# Patient Record
Sex: Male | Born: 1985
Health system: Southern US, Community
[De-identification: ages and names within clinical notes are randomized; demographics above are authoritative.]

## PROBLEM LIST (undated history)

## (undated) DIAGNOSIS — F329 Major depressive disorder, single episode, unspecified: Secondary | ICD-10-CM

## (undated) DIAGNOSIS — F419 Anxiety disorder, unspecified: Secondary | ICD-10-CM

## (undated) DIAGNOSIS — F32A Depression, unspecified: Secondary | ICD-10-CM

## (undated) HISTORY — DX: Anxiety disorder, unspecified: F41.9

## (undated) HISTORY — DX: Major depressive disorder, single episode, unspecified: F32.9

## (undated) HISTORY — DX: Depression, unspecified: F32.A

---

## 2018-02-08 DIAGNOSIS — T148XXA Other injury of unspecified body region, initial encounter: Secondary | ICD-10-CM | POA: Diagnosis not present

## 2018-02-08 DIAGNOSIS — S61213A Laceration without foreign body of left middle finger without damage to nail, initial encounter: Secondary | ICD-10-CM | POA: Diagnosis not present

## 2018-02-18 DIAGNOSIS — Z4802 Encounter for removal of sutures: Secondary | ICD-10-CM | POA: Diagnosis not present

## 2018-03-24 ENCOUNTER — Ambulatory Visit: Payer: BLUE CROSS/BLUE SHIELD | Admitting: Family Medicine

## 2018-03-24 ENCOUNTER — Encounter: Payer: Self-pay | Admitting: Family Medicine

## 2018-03-24 ENCOUNTER — Ambulatory Visit (INDEPENDENT_AMBULATORY_CARE_PROVIDER_SITE_OTHER): Payer: BLUE CROSS/BLUE SHIELD

## 2018-03-24 VITALS — BP 122/70 | HR 41 | Temp 97.7°F | Ht 70.0 in | Wt 197.8 lb

## 2018-03-24 DIAGNOSIS — L570 Actinic keratosis: Secondary | ICD-10-CM | POA: Diagnosis not present

## 2018-03-24 DIAGNOSIS — Z6828 Body mass index (BMI) 28.0-28.9, adult: Secondary | ICD-10-CM | POA: Diagnosis not present

## 2018-03-24 DIAGNOSIS — M7989 Other specified soft tissue disorders: Secondary | ICD-10-CM | POA: Diagnosis not present

## 2018-03-24 DIAGNOSIS — M67479 Ganglion, unspecified ankle and foot: Secondary | ICD-10-CM

## 2018-03-24 DIAGNOSIS — Z114 Encounter for screening for human immunodeficiency virus [HIV]: Secondary | ICD-10-CM | POA: Diagnosis not present

## 2018-03-24 DIAGNOSIS — F39 Unspecified mood [affective] disorder: Secondary | ICD-10-CM | POA: Diagnosis not present

## 2018-03-24 DIAGNOSIS — Z13220 Encounter for screening for lipoid disorders: Secondary | ICD-10-CM | POA: Diagnosis not present

## 2018-03-24 LAB — COMPREHENSIVE METABOLIC PANEL
ALK PHOS: 67 U/L (ref 39–117)
ALT: 19 U/L (ref 0–53)
AST: 19 U/L (ref 0–37)
Albumin: 4.5 g/dL (ref 3.5–5.2)
BILIRUBIN TOTAL: 0.6 mg/dL (ref 0.2–1.2)
BUN: 13 mg/dL (ref 6–23)
CO2: 29 meq/L (ref 19–32)
CREATININE: 0.98 mg/dL (ref 0.40–1.50)
Calcium: 9.4 mg/dL (ref 8.4–10.5)
Chloride: 105 mEq/L (ref 96–112)
GFR: 94.24 mL/min (ref >60.00)
GLUCOSE: 89 mg/dL (ref 70–99)
Potassium: 3.9 mEq/L (ref 3.5–5.1)
Sodium: 141 mEq/L (ref 135–145)
TOTAL PROTEIN: 6.9 g/dL (ref 6.0–8.3)

## 2018-03-24 LAB — LIPID PANEL
CHOL/HDL RATIO: 2
Cholesterol: 141 mg/dL (ref 0–200)
HDL: 81.7 mg/dL (ref >39.00)
LDL Cholesterol: 45 mg/dL (ref 0–99)
NONHDL: 58.82
Triglycerides: 71 mg/dL (ref 0.0–149.0)
VLDL: 14.2 mg/dL (ref 0.0–40.0)

## 2018-03-24 LAB — CBC
HCT: 43 % (ref 39.0–52.0)
HEMOGLOBIN: 14.9 g/dL (ref 13.0–17.0)
MCHC: 34.5 g/dL (ref 30.0–36.0)
MCV: 88.4 fl (ref 78.0–100.0)
Platelets: 231 10*3/uL (ref 150.0–400.0)
RBC: 4.87 Mil/uL (ref 4.22–5.81)
RDW: 13.2 % (ref 11.5–15.5)
WBC: 6 10*3/uL (ref 4.0–10.5)

## 2018-03-24 LAB — TSH: TSH: 2.65 u[IU]/mL (ref 0.35–4.50)

## 2018-03-24 NOTE — Progress Notes (Signed)
Subjective:  Walter Maldonado is a 32 y.o. male who presents today with a chief complaint of mood disorder and to establish care.   HPI:  Mood Disorder, chronic problem, new to provider Started  Several year history.  He was started on sertraline by his previous physician and he has been on this for the past several years.  Symptoms have overall been very well controlled.  Reports having mostly depression with anxiety.  Also reports some concern for bipolar.  Reports that he is currently on sertraline 10 mg daily.  He has tried coming off of this in the past has not been successful due to withdrawal side effects.  No reported side effects to the medication.  Medication helps him with his mood and ability to function during his day-to-day activities.  [facility]  Decreased Interest 0  Down, Depressed, Hopeless 1  PHQ - 2 Score 1  Altered sleeping 0  Tired, decreased energy 0  Change in appetite 0  Feeling bad or failure about yourself  1  Trouble concentrating 0  Moving slowly or fidgety/restless 0  Suicidal thoughts 0  PHQ-9 Score 2  Difficult doing work/chores Not difficult at all   ROS: No SI or HI.  Skin Lesion, New Problem Located on right forehead.  Present for the past 3 to 4 years.  No treatments tried.  No bleeding.  Occasionally painful and gets irritated.  No clear precipitating events.  No obvious alleviating or aggravating factor.  Feet masses, new problem Patient with several year history of masses on the dorsal aspect of his feet bilaterally.  Patient reports that his mother has similar masses.  He has had no significant change in shape or characteristic of the mass over the past several years.  No specific treatments tried.  He thinks that it is due to his bones were performed.  He also has a few small masses on the bottom of his left foot.  These masses are not painful unless become irritated.  No specific treatments tried.  ROS: Per HPI,  otherwise a complete review of systems was negative.   PMH:  The following were reviewed and entered/updated in epic: Past Medical History:  Diagnosis Date  . Depression    Patient Active Problem List   Diagnosis Date Noted  . Mood disorder (HCC) 03/24/2018  . Actinic keratosis 03/24/2018  . Ganglion cyst of foot 03/24/2018   History reviewed. No pertinent surgical history.  Family History  Problem Relation Age of Onset  . Bipolar disorder Father   . Diabetes Father   . Diabetes Paternal Grandfather   . Diabetes Paternal Uncle   . Diabetes Paternal Uncle     Medications- reviewed and updated Current Outpatient Medications  Medication Sig Dispense Refill  . sertraline (ZOLOFT) 25 MG tablet Take 10 mg by mouth daily.     No current facility-administered medications for this visit.     Allergies-reviewed and updated No Known Allergies  Social History   Socioeconomic History  . Marital status: Married    Spouse name: Not on file  . Number of children: 4  . Years of education: Not on file  . Highest education level: Not on file  Occupational History  . Not on file  Social Needs  . Financial resource strain: Not on file  . Food insecurity:    Worry: Not on file    Inability: Not on file  . Transportation needs:    Medical: Not on file  Non-medical: Not on file  Tobacco Use  . Smoking status: Never Smoker  . Smokeless tobacco: Never Used  Substance and Sexual Activity  . Alcohol use: Not Currently  . Drug use: Never  . Sexual activity: Yes    Partners: Female  Lifestyle  . Physical activity:    Days per week: Not on file    Minutes per session: Not on file  . Stress: Not on file  Relationships  . Social connections:    Talks on phone: Not on file    Gets together: Not on file    Attends religious service: Not on file    Active member of club or organization: Not on file    Attends meetings of clubs or organizations: Not on file    Relationship  status: Not on file  Other Topics Concern  . Not on file  Social History Narrative  . Not on file    Objective:  Physical Exam: BP 122/70 (BP Location: Left Arm, Patient Position: Sitting, Cuff Size: Normal)   Pulse (!) 41   Temp 97.7 F (36.5 C) (Oral)   Ht 5\' 10"  (1.778 m)   Wt 197 lb 12.8 oz (89.7 kg)   SpO2 98%   BMI 28.38 kg/m   Gen: NAD, resting comfortably CV: RRR with no murmurs appreciated Pulm: NWOB, CTAB with no crackles, wheezes, or rhonchi GI: Normal bowel sounds present. Soft, Nontender, Nondistended. MSK:  -Left foot: Cystic lesion noted on dorsal aspect of midfoot.  Freely mobile.  No surrounding erythema or edema.  Neurovascular intact distally. Approximately 3 cystic lesions noted on flexor hallucis tendon group. -Right foot: Cystic lesion noted on dorsal aspect of midfoot.  Freely mobile.  No surrounding erythema or edema.  Neurovascular intact distally. Skin: Warm, dry.  Approximately 1 to 2 mm area of actinic keratosis on right forehead. Neuro: Grossly normal, moves all extremities Psych: Normal affect and thought content  Cryotherapy Procedure Note  Pre-operative Diagnosis: Actinic keratosis  Locations: Right forehead  Indications: Therapeutic  Procedure Details  Patient informed of risks (permanent scarring, infection, light or dark discoloration, bleeding, infection, weakness, numbness and recurrence of the lesion) and benefits of the procedure and verbal informed consent obtained.  The areas are treated with liquid nitrogen therapy, frozen until ice ball extended 2 mm beyond lesion, allowed to thaw, and treated again. The patient tolerated procedure well.  The patient was instructed on post-op care, warned that there may be blister formation, redness and pain. Recommend OTC analgesia as needed for pain.  Condition: Stable  Complications: none.  Assessment/Plan:  Mood disorder (HCC) Stable.  PHQ 9 score of 2 today.  We will continue his  current dose of Zoloft.  It is unclear how much he is taking.  Patient reports 10 mg tablet however I am not aware that it comes in this dosage.  Patient will double check when he gets home and let me know.    Check CBC, CMET, and TSH today.  Given his symptoms are stable, he will follow-up with me in 1 year.  Actinic keratosis Cryotherapy applied today.  See above procedure note.  Patient tolerated well without complication.  Discussed reasons to return to care.  Ganglion cyst of foot Patient with what appears to be bilateral ganglion cysts. Given that they have not changed in characteristic over the past several years, do not need further intervention at this point.  We will check an x-ray both of his feet today to rule out other  possible pathology.  Consider referral to sports medicine if this is become painful or problematic.  Preventive health care Check lipid panel.  Check HIV antibody.  Screen for diabetes on CMET.  Katina Degree. Jimmey Ralph, MD 03/24/2018 11:30 AM

## 2018-03-24 NOTE — Assessment & Plan Note (Signed)
Cryotherapy applied today.  See above procedure note.  Patient tolerated well without complication.  Discussed reasons to return to care.

## 2018-03-24 NOTE — Assessment & Plan Note (Signed)
Stable.  PHQ 9 score of 2 today.  We will continue his current dose of Zoloft.  It is unclear how much he is taking.  Patient reports 10 mg tablet however I am not aware that it comes in this dosage.  Patient will double check when he gets home and let me know.    Check CBC, CMET, and TSH today.  Given his symptoms are stable, he will follow-up with me in 1 year.

## 2018-03-24 NOTE — Patient Instructions (Addendum)
It was very nice to see you today!  Please keep up the good work! We will not make any medication changes today.  Please let me know what dose of sertraline you need me to send in for you.  The spot on your forehead is actinic keratosis.  This is an area of some damaged skin that could potentially turn into a precancer of the future.  We froze this area.  Please let me know if the area does not improve over the next 1 to 2 weeks.  I think the bumps on your feet are cysts.  If you are not having issues, we do not need to do anything else at this point.  We will check an x-ray today to make sure there is nothing else going on.  If they become symptomatic in the future, please let me know and we can get you to see the sports medicine doctor.  We will check blood work today.  Come back to see me in 1 year for your physical, or sooner as needed.  Take care, Dr Jimmey RalphParker

## 2018-03-24 NOTE — Assessment & Plan Note (Addendum)
Patient with what appears to be bilateral ganglion cysts. Given that they have not changed in characteristic over the past several years, do not need further intervention at this point.  We will check an x-ray both of his feet today to rule out other possible pathology.  Consider referral to sports medicine if this is become painful or problematic.

## 2018-03-25 LAB — HIV ANTIBODY (ROUTINE TESTING W REFLEX): HIV 1&2 Ab, 4th Generation: NONREACTIVE

## 2018-03-26 ENCOUNTER — Other Ambulatory Visit: Payer: Self-pay

## 2018-03-26 MED ORDER — SERTRALINE HCL 100 MG PO TABS: 100.0000 mg | ORAL_TABLET | Freq: Every day | ORAL | 3 refills | Status: DC

## 2018-05-01 ENCOUNTER — Other Ambulatory Visit: Payer: Self-pay | Admitting: Family Medicine

## 2018-10-14 ENCOUNTER — Ambulatory Visit: Payer: BLUE CROSS/BLUE SHIELD | Admitting: Family Medicine

## 2018-10-14 ENCOUNTER — Encounter: Payer: Self-pay | Admitting: Family Medicine

## 2018-10-14 ENCOUNTER — Ambulatory Visit (INDEPENDENT_AMBULATORY_CARE_PROVIDER_SITE_OTHER): Payer: BLUE CROSS/BLUE SHIELD

## 2018-10-14 ENCOUNTER — Ambulatory Visit: Payer: Self-pay | Admitting: *Deleted

## 2018-10-14 VITALS — BP 126/88 | HR 48 | Temp 97.8°F | Ht 70.0 in | Wt 204.6 lb

## 2018-10-14 DIAGNOSIS — R059 Cough, unspecified: Secondary | ICD-10-CM

## 2018-10-14 DIAGNOSIS — R05 Cough: Secondary | ICD-10-CM | POA: Diagnosis not present

## 2018-10-14 DIAGNOSIS — J0110 Acute frontal sinusitis, unspecified: Secondary | ICD-10-CM

## 2018-10-14 DIAGNOSIS — R0989 Other specified symptoms and signs involving the circulatory and respiratory systems: Secondary | ICD-10-CM | POA: Diagnosis not present

## 2018-10-14 MED ORDER — BETAMETHASONE SOD PHOS & ACET 6 (3-3) MG/ML IJ SUSP
12.0000 mg | Freq: Once | INTRAMUSCULAR | Status: AC
Start: 2018-10-14 — End: 2018-10-14
  Administered 2018-10-14: 12 mg via INTRAMUSCULAR

## 2018-10-14 MED ORDER — FLUTICASONE PROPIONATE 50 MCG/ACT NA SUSP: 2.0000 | Freq: Every day | NASAL | 6 refills | Status: DC

## 2018-10-14 NOTE — Telephone Encounter (Signed)
See note

## 2018-10-14 NOTE — Telephone Encounter (Signed)
Chest congestion for 2 weeks and now feels like it's moved to his head. Sinus congestion and sinus pressure with a loose cough with yellow phlegm. Denies fever/SOB/CP. Became dizzy with blowing his nose this morning at work. PCP not available today. Appointment made with Dr. Artis Flock for 11:40a.   Reason for Disposition . [1] MODERATE dizziness (e.g., interferes with normal activities) AND [2] has NOT been evaluated by physician for this  (Exception: dizziness caused by heat exposure, sudden standing, or poor fluid intake)  Protocols used: DIZZINESS [employer]

## 2018-10-14 NOTE — Patient Instructions (Signed)
flonase at night Cool mist humidifier Honey for cough Steroid shot today Let me know if not getting better, fever, or any more weird stuff with blowing nose.   Acute Bronchitis, Adult Acute bronchitis is when air tubes (bronchi) in the lungs suddenly get swollen. The condition can make it hard to breathe. It can also cause these symptoms:  A cough.  Coughing up clear, yellow, or green mucus.  Wheezing.  Chest congestion.  Shortness of breath.  A fever.  Body aches.  Chills.  A sore throat. Follow these instructions at home:  Medicines  Take over-the-counter and prescription medicines only as told by your doctor.  If you were prescribed an antibiotic medicine, take it as told by your doctor. Do not stop taking the antibiotic even if you start to feel better. General instructions  Rest.  Drink enough fluids to keep your pee (urine) pale yellow.  Avoid smoking and secondhand smoke. If you smoke and you need help quitting, ask your doctor. Quitting will help your lungs heal faster.  Use an inhaler, cool mist vaporizer, or humidifier as told by your doctor.  Keep all follow-up visits as told by your doctor. This is important. How is this prevented? To lower your risk of getting this condition again:  Wash your hands often with soap and water. If you cannot use soap and water, use hand sanitizer.  Avoid contact with people who have cold symptoms.  Try not to touch your hands to your mouth, nose, or eyes.  Make sure to get the flu shot every year. Contact a doctor if:  Your symptoms do not get better in 2 weeks. Get help right away if:  You cough up blood.  You have chest pain.  You have very bad shortness of breath.  You become dehydrated.  You faint (pass out) or keep feeling like you are going to pass out.  You keep throwing up (vomiting).  You have a very bad headache.  Your fever or chills gets worse. This information is not intended to replace  advice given to you by your health care provider. Make sure you discuss any questions you have with your health care provider. Document Released: 03/10/2008 Document Revised: 05/06/2017 Document Reviewed: 03/12/2016 Elsevier Interactive Patient Education  2019 [employer].

## 2018-10-14 NOTE — Progress Notes (Signed)
Patient: Walter Maldonado MRN: [id number] DOB: Jul 13, 1986 PCP: Ardith Dark, MD     Subjective:  Chief Complaint  Patient presents with  . sinus congestion  . Cough    HPI: The patient is a 33 y.o. male who presents today for sinus symptoms. Symptoms started about 2 weeks ago. He started to have congestion and cough that got progressively worse. He states this has improved some, but still can hear stuff in his chest when he breaths. He woke up yesterday AM and felt some nasal pressure. He feels like a balloon in his sinuses and about to explode. He took some allergy/sinus medication. Woke up again and had more pressure in his in sinuses. He blew his nose today and nearly fell as he got so dizzy and the whole left side of his head was pounding. No fever/chills. No nasal spray used. No teeth pain. Blowing out junk. No issues working out.   Review of Systems  Constitutional: Negative for chills, fatigue and fever.  HENT: Positive for congestion, ear pain, postnasal drip, rhinorrhea, sinus pressure and sinus pain. Negative for sore throat.   Respiratory: Positive for cough. Negative for shortness of breath.   Cardiovascular: Negative for chest pain.  Gastrointestinal: Negative for abdominal pain and nausea.  Musculoskeletal: Negative for back pain, myalgias and neck pain.  Neurological: Positive for dizziness. Negative for headaches.  Psychiatric/Behavioral: Positive for sleep disturbance.    Allergies Patient has No Known Allergies.  Past Medical History Patient  has a past medical history of Depression.  Surgical History Patient  has no past surgical history on file.  Family History Pateint's family history includes Bipolar disorder in his father; Diabetes in his father, paternal grandfather, paternal uncle, and paternal uncle; Stroke in his father.  Social History Patient  reports that he has never smoked. He has never used smokeless tobacco. He reports previous alcohol use. He  reports that he does not use drugs.    Objective: Vitals:   10/14/18 1154  BP: 126/88  Pulse: (!) 48  Temp: 97.8 F (36.6 C)  TempSrc: Oral  SpO2: 98%  Weight: 204 lb 9.6 oz (92.8 kg)  Height: 5\' 10"  (1.778 m)    Body mass index is 29.36 kg/m.  Physical Exam Vitals signs reviewed.  Constitutional:      Appearance: Normal appearance.  HENT:     Head:     Comments: Mild erythema around external left canal. No bulge, no erythema of TM No TTP over sinuses.     Right Ear: Tympanic membrane, ear canal and external ear normal.     Left Ear: Tympanic membrane, ear canal and external ear normal.  Neck:     Musculoskeletal: Normal range of motion and neck supple.  Cardiovascular:     Rate and Rhythm: Normal rate and regular rhythm.     Heart sounds: Normal heart sounds.  Pulmonary:     Effort: Pulmonary effort is normal.     Breath sounds: Wheezing (very occasional expiratory wheeze) present.     Comments: Course breath sounds.  Abdominal:     General: Abdomen is flat.     Palpations: Abdomen is soft.  Lymphadenopathy:     Cervical: No cervical adenopathy.  Skin:    Comments: Poison ivy on forearms.   Neurological:     General: No focal deficit present.     Mental Status: He is alert and oriented to person, place, and time.     Cranial Nerves: No cranial nerve deficit.  CXR: clear no pneunomia. Official read pending.   Assessment/plan: 1. Cough Likely bronchitis. Discussed he is getting better and could continue to last 6 weeks. No sign of bacterial infection. Will do steroid shot to help very rare wheeze, sinusitis and poison ivy. Declines inhaler as he does not feel tight or short of breath. Recommended honey and cool mis humidifier. Fever/worsening symptoms he is to let me know.  - DG Chest 2 View; Future - betamethasone acetate-betamethasone sodium phosphate (CELESTONE) injection 12 mg  2. Subacute frontal sinusitis Exam actually normal with no tenderness.  Conservative therapy with steroid shot and flonase. Let me know if getting worse and will send in abx, but will hold off at this time. Also reassured him that the dizzy episode was likely vagal/pressure. He's been fine and is fine working out. Please f/u if happens again or if any other abnormal symptoms or continue dizziness.       Return if symptoms worsen or fail to improve.    Orland Mustard, MD [facility]   10/14/2018

## 2018-10-14 NOTE — Telephone Encounter (Signed)
Noted  

## 2018-12-27 ENCOUNTER — Encounter: Payer: Self-pay | Admitting: Family Medicine

## 2018-12-28 ENCOUNTER — Ambulatory Visit (INDEPENDENT_AMBULATORY_CARE_PROVIDER_SITE_OTHER): Payer: BLUE CROSS/BLUE SHIELD | Admitting: Family Medicine

## 2018-12-28 ENCOUNTER — Encounter: Payer: Self-pay | Admitting: Family Medicine

## 2018-12-28 DIAGNOSIS — G5603 Carpal tunnel syndrome, bilateral upper limbs: Secondary | ICD-10-CM

## 2018-12-28 MED ORDER — DICLOFENAC SODIUM 75 MG PO TBEC: 75.0000 mg | DELAYED_RELEASE_TABLET | Freq: Two times a day (BID) | ORAL | 0 refills | Status: DC

## 2018-12-28 NOTE — Progress Notes (Signed)
    Chief Complaint:  Walter Maldonado is a 33 y.o. male who presents today for a virtual office visit with a chief complaint of paresthesias.   Assessment/Plan:  Carpal tunnel syndrome History and observation/exam consistent with carpal tunnel syndrome.  Will start diclofenac 75 mg twice daily for the next 1 to 2 weeks.  Recommended cock-up wrist splint for use at night.  Discussed home exercise program and handout will be sent via my chart.  Discussed reasons to return to care.  Follow-up as needed.    Subjective:  HPI:  Paresthesias Started about a month ago. Located in bilateral arms from the elbow down. Symptoms typically only occur after working outside however does often wake up at night with painful.  Pain is located in all of his fingers.  He has not tried anything for it.  No neck pain.  No upper arm pain.  No weakness.  No other obvious alleviating or aggravating factors.  ROS: Per HPI  PMH: He reports that he has never smoked. He has never used smokeless tobacco. He reports previous alcohol use. He reports that he does not use drugs.      Objective/Observations  Physical Exam: Gen: NAD, resting comfortably Pulm: Normal work of breathing MSK: Phalen positive.  Tinel sign negative at wrist and elbows.  Moves upper extremities spontaneously. Neuro: Grossly normal, moves all extremities Psych: Normal affect and thought content  Virtual Visit via Video   I connected with Dechlan Horstmann on 12/28/18 at  9:20 AM EDT by a video enabled telemedicine application and verified that I am speaking with the correct person using two identifiers. I discussed the limitations of evaluation and management by telemedicine and the availability of in person appointments. The patient expressed understanding and agreed to proceed.   Patient location: Home Provider location: [facility] Persons participating in the virtual visit: Myself and Patient     Katina Degree. Jimmey Ralph, MD  12/28/2018 9:56 AM

## 2019-02-08 ENCOUNTER — Other Ambulatory Visit: Payer: Self-pay | Admitting: Family Medicine

## 2019-04-05 ENCOUNTER — Other Ambulatory Visit: Payer: Self-pay | Admitting: Family Medicine

## 2019-04-12 ENCOUNTER — Encounter: Payer: Self-pay | Admitting: Family Medicine

## 2019-04-13 ENCOUNTER — Telehealth: Payer: Self-pay | Admitting: Physician Assistant

## 2019-04-13 ENCOUNTER — Other Ambulatory Visit: Payer: BC Managed Care – PPO

## 2019-04-13 ENCOUNTER — Encounter: Payer: Self-pay | Admitting: Physician Assistant

## 2019-04-13 ENCOUNTER — Ambulatory Visit (INDEPENDENT_AMBULATORY_CARE_PROVIDER_SITE_OTHER): Payer: BC Managed Care – PPO | Admitting: Physician Assistant

## 2019-04-13 ENCOUNTER — Telehealth: Payer: Self-pay | Admitting: *Deleted

## 2019-04-13 VITALS — Temp 97.9°F | Ht 70.0 in | Wt 190.0 lb

## 2019-04-13 DIAGNOSIS — R6889 Other general symptoms and signs: Secondary | ICD-10-CM | POA: Diagnosis not present

## 2019-04-13 DIAGNOSIS — Z7189 Other specified counseling: Secondary | ICD-10-CM | POA: Diagnosis not present

## 2019-04-13 DIAGNOSIS — Z20822 Contact with and (suspected) exposure to covid-19: Secondary | ICD-10-CM

## 2019-04-13 DIAGNOSIS — Z20828 Contact with and (suspected) exposure to other viral communicable diseases: Secondary | ICD-10-CM

## 2019-04-13 NOTE — Progress Notes (Signed)
Virtual Visit via Video   I connected with Walter Maldonado on 04/13/19 at  9:40 AM EDT by a video enabled telemedicine application and verified that I am speaking with the correct person using two identifiers. Location patient: Home Location provider: [facility], Office Persons participating in the virtual visit: Meredith, Mells PA-C.  I discussed the limitations of evaluation and management by telemedicine and the availability of in person appointments. The patient expressed understanding and agreed to proceed.  I acted as a Education administrator for [employer], PA-C [employer], LPN  Subjective:   HPI:   Stomach cramps Pt c/o stomach cramps and diarrhea every time after he eats, started on Sunday.  Tuesday had a fever of 100.3 and chills, sweated it out last night and temp today 97.9. Using elderberry. Denies headaches, cough, sore throat, no nausea or vomiting and no contacts with COVID-19 that he is aware of. He is trying to drink plenty of fluids. Is also taking a vitamin C supplement. Had toast and applesauce this morning and tolerated well.  ROS: See pertinent positives and negatives per HPI.  Patient Active Problem List   Diagnosis Date Noted  . Mood disorder ([place]) 03/24/2018  . Actinic keratosis 03/24/2018  . Ganglion cyst of foot 03/24/2018    Social History   Tobacco Use  . Smoking status: Never Smoker  . Smokeless tobacco: Never Used  Substance Use Topics  . Alcohol use: Not Currently    Current Outpatient Medications:  .  sertraline (ZOLOFT) 100 MG tablet, TAKE 1 TABLET BY MOUTH EVERY DAY, Disp: 90 tablet, Rfl: 2 .  fluticasone (FLONASE) 50 MCG/ACT nasal spray, SPRAY 2 SPRAYS INTO EACH NOSTRIL EVERY DAY (Patient not taking: Reported on 04/13/2019), Disp: 48 mL, Rfl: 2  No Known Allergies  Objective:   VITALS: Per patient if applicable, see vitals. GENERAL: Alert, appears well and in no acute distress. HEENT: Atraumatic, conjunctiva clear, no  obvious abnormalities on inspection of external nose and ears. NECK: Normal movements of the head and neck. CARDIOPULMONARY: No increased WOB. Speaking in clear sentences. I:E ratio WNL.  MS: Moves all visible extremities without noticeable abnormality. PSYCH: Pleasant and cooperative, well-groomed. Speech normal rate and rhythm. Affect is appropriate. Insight and judgement are appropriate. Attention is focused, linear, and appropriate.  NEURO: CN grossly intact. Oriented as arrived to appointment on time with no prompting. Moves both UE equally.  SKIN: No obvious lesions, wounds, erythema, or cyanosis noted on face or hands.  Assessment and Plan:   [place] was seen today for stomach cramping.  Diagnoses and all orders for this visit:  Suspected Covid-19 Virus Infection  Advice Given About Covid-19 Virus Infection   Patient has GI symptoms of diarrhea and low-grade fever/chills x 3 days. This is likely a viral infection, which can come from a number of respiratory viruses. At this time, I do recommend COVID-19 testing and self-quaranting until results have returned. No red flags on discussion with patient. Advised if they experience a "second sickening" or worsening symptoms as the illness progresses, they are to call the office for further instructions or seek emergent evaluation for any severe symptoms.   . Reviewed expectations re: course of current medical issues. . Discussed self-management of symptoms. . Outlined signs and symptoms indicating need for more acute intervention. . Patient verbalized understanding and all questions were answered. Marland Kitchen Health Maintenance issues including appropriate healthy diet, exercise, and smoking avoidance were discussed with patient. . See orders for this visit as  documented in the electronic medical record.  I discussed the assessment and treatment plan with the patient. The patient was provided an opportunity to ask questions and all were answered.  The patient agreed with the plan and demonstrated an understanding of the instructions.   The patient was advised to call back or seek an in-person evaluation if the symptoms worsen or if the condition fails to improve as anticipated.   CMA or LPN served as scribe during this visit. History, Physical, and Plan performed by medical provider. The above documentation has been reviewed and is accurate and complete.   [place]ampsteadSamantha Worley, GeorgiaPA 04/13/2019

## 2019-04-13 NOTE — Telephone Encounter (Signed)
Pt referred for covid-19 testing by Inda Coke, PA.  Pt notified and he is scheduled for today at the [facility] at 1:30. He is advised that this is a drive thru test site, so stay in car with mask on and windows rolled up until time for testing. He voiced understanding.

## 2019-04-13 NOTE — Telephone Encounter (Signed)
Please call patient and schedule COVID-19 testing at your earliest convenience.  Thank you, Larraine Argo PA-C  

## 2019-04-15 ENCOUNTER — Encounter: Payer: Self-pay | Admitting: Physician Assistant

## 2019-04-18 LAB — NOVEL CORONAVIRUS, NAA: SARS-CoV-2, NAA: NOT DETECTED

## 2019-10-24 DIAGNOSIS — Z20828 Contact with and (suspected) exposure to other viral communicable diseases: Secondary | ICD-10-CM | POA: Diagnosis not present

## 2019-11-13 ENCOUNTER — Other Ambulatory Visit: Payer: Self-pay | Admitting: Family Medicine

## 2019-12-28 ENCOUNTER — Telehealth: Payer: Self-pay

## 2019-12-28 NOTE — Telephone Encounter (Signed)
Patient refused Flu Vaccine. 

## 2019-12-28 NOTE — Telephone Encounter (Signed)
Documented on patient charge

## 2020-02-16 ENCOUNTER — Other Ambulatory Visit: Payer: Self-pay | Admitting: Family Medicine

## 2020-05-21 ENCOUNTER — Other Ambulatory Visit: Payer: Self-pay | Admitting: Family Medicine

## 2020-08-09 IMAGING — DX DG CHEST 2V
2 series · 2 of 2 positions shown · non-contrast
Comparison: None

CLINICAL DATA: Increased breath sounds for 2 weeks

EXAM:
CHEST - 2 VIEW

[chest pa]
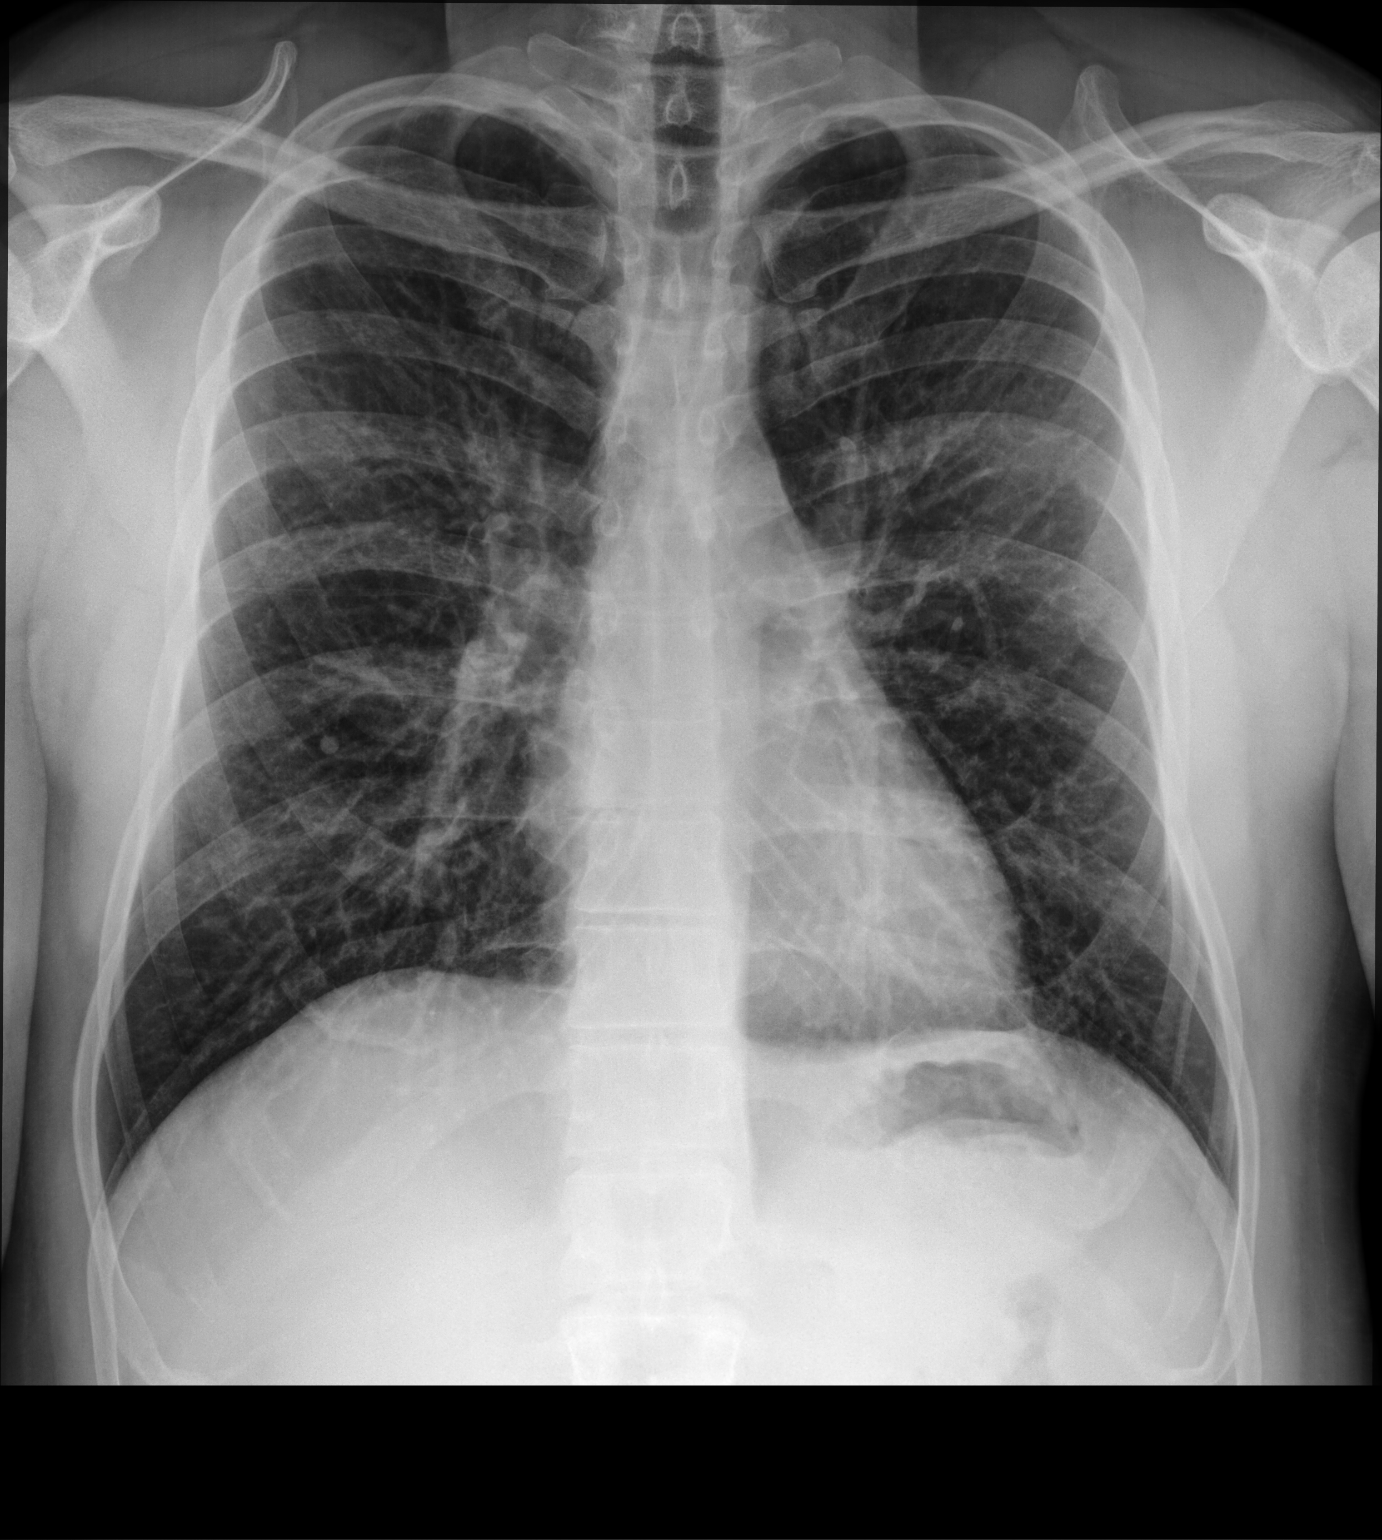

[chest lat]
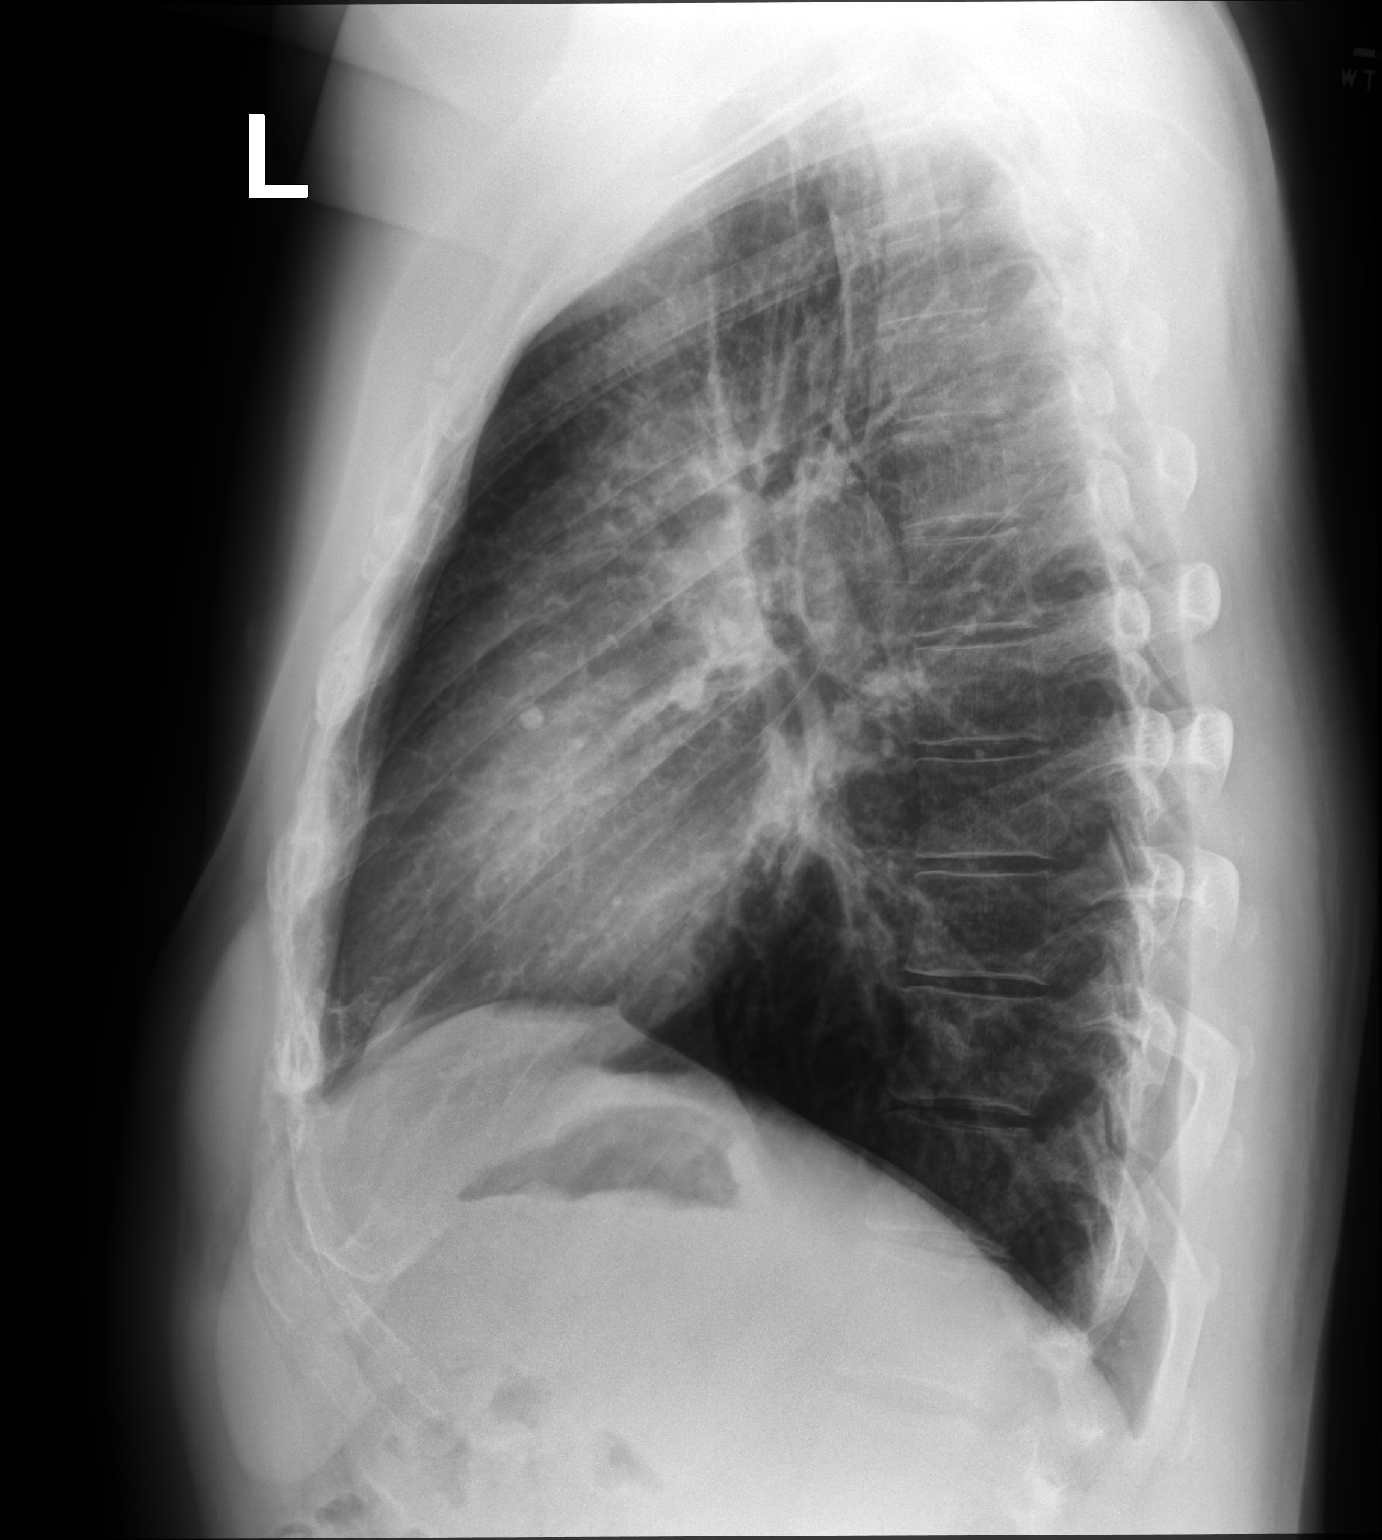

[2 of 2 positions shown; findings below may reference images not displayed]

FINDINGS: Lungs are hyperinflated likely related to a vigorous inspiratory
effort. Mild interstitial changes are noted likely related to
bronchitis. No focal confluent infiltrate or effusion is seen. No
bony abnormality is noted. Calcified granuloma is noted in the right
middle lobe.
IMPRESSION: Mild bronchitic changes without acute infiltrate

## 2020-08-20 ENCOUNTER — Telehealth: Payer: Self-pay

## 2020-08-20 ENCOUNTER — Telehealth: Payer: Self-pay | Admitting: Family Medicine

## 2020-08-20 MED ORDER — BUDESONIDE 180 MCG/ACT IN AEPB: 1.0000 | INHALATION_SPRAY | Freq: Two times a day (BID) | RESPIRATORY_TRACT | 0 refills | Status: DC

## 2020-08-20 NOTE — Telephone Encounter (Signed)
Positive at home covid test  Headache,,fatigue,fever,chills,cough  Patients requesting a call to get some recommendations

## 2020-08-20 NOTE — Telephone Encounter (Signed)
Called back patient and started him on nutraceutical bundle of vitamins, high dose aspirin (no contraindication), sent in pulmicort and advised gargling with mouth wash TID, outside and getting pulse ox. He has no difficulty breathing or shortness of breath. Just feels achy with fever. Symptoms started on Saturday. Will do telehealth and send for infusion if he qualifies as well. Need to check BMI as he has no other health issues. Also discussed ivermectin and he would like this. Discussed not FDA approved for this, but have over 65 studies showing it's benefit with no harm with appropriate dosing. He would like this and I sent in.  Discussed keeping pulse ox >93-94% and appropriate quarantining guidelines. Er precautions given.  F/u on Wednesday.  Dr. Artis Flock

## 2020-08-20 NOTE — Telephone Encounter (Signed)
Walter Maldonado, Please set him up for telehealth visit on Wednesday for covid. Already talked to him today and sent in medication, but need to follow up.  Thanks!  Dr. Artis Flock

## 2020-08-21 ENCOUNTER — Other Ambulatory Visit: Payer: Self-pay | Admitting: Family Medicine

## 2020-08-21 NOTE — Telephone Encounter (Signed)
Appointment scheduled for Wednesday @ 2:30.

## 2020-08-22 ENCOUNTER — Telehealth: Payer: BC Managed Care – PPO | Admitting: Family Medicine

## 2020-08-22 NOTE — Progress Notes (Deleted)
Patient: Walter Maldonado MRN: [id number] DOB: 1986/03/05 PCP: Ardith Dark, MD     I connected with Drue Stager on 08/22/20 at @CHLAPPTIME @ by a video enabled telemedicine application and verified that I am speaking with the correct person using two identifiers.  Location patient: Home Location provider: [facility], Office Persons participating in this virtual visit: ***  I discussed the limitations of evaluation and management by telemedicine and the availability of in person appointments. The patient expressed understanding and agreed to proceed.   Subjective:  No chief complaint on file.   HPI: The patient is a 34 y.o. male who presents today for Positive Covid result.   Review of Systems  Allergies Patient has No Known Allergies.  Past Medical History Patient  has a past medical history of Depression.  Surgical History Patient  has no past surgical history on file.  Family History Pateint's family history includes Bipolar disorder in his father; Diabetes in his father, paternal grandfather, paternal uncle, and paternal uncle; Stroke in his father.  Social History Patient  reports that he has never smoked. He has never used smokeless tobacco. He reports previous alcohol use. He reports that he does not use drugs.    Objective: There were no vitals filed for this visit.  There is no height or weight on file to calculate BMI.  Physical Exam     Assessment/plan:      No follow-ups on file.  Records requested if needed. Time spent with patient: *** minutes, of which >50% was spent in obtaining information about his symptoms, reviweing his previous labs, evaluations, and treatments, counseling him about his conditions (please see discussed topics above), and developing a plan to further investigate it; he had a number of questions which I addressed.    20, MD [facility]  08/22/2020

## 2020-11-18 ENCOUNTER — Other Ambulatory Visit: Payer: Self-pay | Admitting: Family Medicine

## 2020-11-19 NOTE — Telephone Encounter (Signed)
LVM to schedule appointe with PCP Last OV on 09/07/2019

## 2020-12-17 DIAGNOSIS — F419 Anxiety disorder, unspecified: Secondary | ICD-10-CM | POA: Diagnosis not present

## 2020-12-17 DIAGNOSIS — F438 Other reactions to severe stress: Secondary | ICD-10-CM | POA: Diagnosis not present

## 2020-12-24 DIAGNOSIS — F419 Anxiety disorder, unspecified: Secondary | ICD-10-CM | POA: Diagnosis not present

## 2020-12-24 DIAGNOSIS — F438 Other reactions to severe stress: Secondary | ICD-10-CM | POA: Diagnosis not present

## 2021-01-16 DIAGNOSIS — F419 Anxiety disorder, unspecified: Secondary | ICD-10-CM | POA: Diagnosis not present

## 2021-01-16 DIAGNOSIS — F438 Other reactions to severe stress: Secondary | ICD-10-CM | POA: Diagnosis not present

## 2021-01-30 DIAGNOSIS — F438 Other reactions to severe stress: Secondary | ICD-10-CM | POA: Diagnosis not present

## 2021-01-30 DIAGNOSIS — F419 Anxiety disorder, unspecified: Secondary | ICD-10-CM | POA: Diagnosis not present

## 2021-02-07 DIAGNOSIS — F419 Anxiety disorder, unspecified: Secondary | ICD-10-CM | POA: Diagnosis not present

## 2021-02-07 DIAGNOSIS — Z0000 Encounter for general adult medical examination without abnormal findings: Secondary | ICD-10-CM | POA: Diagnosis not present

## 2021-02-07 DIAGNOSIS — F438 Other reactions to severe stress: Secondary | ICD-10-CM | POA: Diagnosis not present

## 2021-02-07 DIAGNOSIS — Z13220 Encounter for screening for lipoid disorders: Secondary | ICD-10-CM | POA: Diagnosis not present

## 2021-02-07 DIAGNOSIS — E559 Vitamin D deficiency, unspecified: Secondary | ICD-10-CM | POA: Diagnosis not present

## 2021-02-14 DIAGNOSIS — F438 Other reactions to severe stress: Secondary | ICD-10-CM | POA: Diagnosis not present

## 2021-02-14 DIAGNOSIS — F419 Anxiety disorder, unspecified: Secondary | ICD-10-CM | POA: Diagnosis not present

## 2021-02-21 ENCOUNTER — Other Ambulatory Visit: Payer: Self-pay | Admitting: Family Medicine

## 2021-02-21 NOTE — Telephone Encounter (Signed)
Left message to return call to our office at their convenience. Patient need OV for medication refills Last visit on 04/13/2019

## 2021-02-28 DIAGNOSIS — F419 Anxiety disorder, unspecified: Secondary | ICD-10-CM | POA: Diagnosis not present

## 2021-02-28 DIAGNOSIS — F438 Other reactions to severe stress: Secondary | ICD-10-CM | POA: Diagnosis not present

## 2021-03-11 DIAGNOSIS — F411 Generalized anxiety disorder: Secondary | ICD-10-CM | POA: Diagnosis not present

## 2021-03-11 DIAGNOSIS — W231XXA Caught, crushed, jammed, or pinched between stationary objects, initial encounter: Secondary | ICD-10-CM | POA: Diagnosis not present

## 2021-03-11 DIAGNOSIS — Z79899 Other long term (current) drug therapy: Secondary | ICD-10-CM | POA: Diagnosis not present

## 2021-03-11 DIAGNOSIS — S9781XA Crushing injury of right foot, initial encounter: Secondary | ICD-10-CM | POA: Diagnosis not present

## 2021-03-11 DIAGNOSIS — S99921A Unspecified injury of right foot, initial encounter: Secondary | ICD-10-CM | POA: Diagnosis not present

## 2021-08-15 DIAGNOSIS — M50321 Other cervical disc degeneration at C4-C5 level: Secondary | ICD-10-CM | POA: Diagnosis not present

## 2021-08-15 DIAGNOSIS — M5459 Other low back pain: Secondary | ICD-10-CM | POA: Diagnosis not present

## 2022-06-30 ENCOUNTER — Encounter: Payer: Self-pay | Admitting: *Deleted

## 2022-08-27 DIAGNOSIS — F419 Anxiety disorder, unspecified: Secondary | ICD-10-CM | POA: Diagnosis not present

## 2022-09-02 DIAGNOSIS — F419 Anxiety disorder, unspecified: Secondary | ICD-10-CM | POA: Diagnosis not present

## 2022-09-10 DIAGNOSIS — F419 Anxiety disorder, unspecified: Secondary | ICD-10-CM | POA: Diagnosis not present

## 2022-09-16 DIAGNOSIS — F419 Anxiety disorder, unspecified: Secondary | ICD-10-CM | POA: Diagnosis not present

## 2022-09-18 ENCOUNTER — Encounter: Payer: Self-pay | Admitting: *Deleted

## 2022-10-14 DIAGNOSIS — F419 Anxiety disorder, unspecified: Secondary | ICD-10-CM | POA: Diagnosis not present

## 2022-10-21 DIAGNOSIS — F419 Anxiety disorder, unspecified: Secondary | ICD-10-CM | POA: Diagnosis not present

## 2022-10-24 ENCOUNTER — Ambulatory Visit (INDEPENDENT_AMBULATORY_CARE_PROVIDER_SITE_OTHER): Payer: BC Managed Care – PPO | Admitting: Physician Assistant

## 2022-10-24 ENCOUNTER — Encounter: Payer: Self-pay | Admitting: Physician Assistant

## 2022-10-24 VITALS — BP 136/79 | Ht 68.0 in | Wt 194.4 lb

## 2022-10-24 DIAGNOSIS — F32A Depression, unspecified: Secondary | ICD-10-CM

## 2022-10-24 DIAGNOSIS — F411 Generalized anxiety disorder: Secondary | ICD-10-CM

## 2022-10-24 MED ORDER — SERTRALINE HCL 100 MG PO TABS: 150.0000 mg | ORAL_TABLET | Freq: Every day | ORAL | 0 refills | Status: DC
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
## 2022-11-04 DIAGNOSIS — F419 Anxiety disorder, unspecified: Secondary | ICD-10-CM | POA: Diagnosis not present

## 2022-11-20 DIAGNOSIS — F419 Anxiety disorder, unspecified: Secondary | ICD-10-CM | POA: Diagnosis not present

## 2022-11-25 DIAGNOSIS — F419 Anxiety disorder, unspecified: Secondary | ICD-10-CM | POA: Diagnosis not present

## 2022-12-02 DIAGNOSIS — F419 Anxiety disorder, unspecified: Secondary | ICD-10-CM | POA: Diagnosis not present

## 2022-12-05 ENCOUNTER — Encounter: Payer: Self-pay | Admitting: Physician Assistant

## 2022-12-05 ENCOUNTER — Ambulatory Visit (INDEPENDENT_AMBULATORY_CARE_PROVIDER_SITE_OTHER): Payer: BC Managed Care – PPO | Admitting: Physician Assistant

## 2022-12-05 DIAGNOSIS — F32A Depression, unspecified: Secondary | ICD-10-CM | POA: Diagnosis not present

## 2022-12-05 DIAGNOSIS — F411 Generalized anxiety disorder: Secondary | ICD-10-CM | POA: Diagnosis not present

## 2022-12-05 MED ORDER — SERTRALINE HCL 100 MG PO TABS: 150.0000 mg | ORAL_TABLET | Freq: Every day | ORAL | 0 refills | Status: DC

## 2022-12-05 NOTE — Progress Notes (Signed)
[facility] Med Check  Patient ID: Walter Maldonado,  MRN: [id number]  PCP: Vivi Barrack, MD  Date of Evaluation: 12/05/2022 Time spent:25 minutes  Chief Complaint:  Chief Complaint   Anxiety; Depression; Follow-up    Virtual Visit via Telehealth  I connected with patient by telephone, with their informed consent, and verified patient privacy and that I am speaking with the correct person using two identifiers.  I am private, in my office and the patient is at work.  I discussed the limitations, risks, security and privacy concerns of performing an evaluation and management service by telephone and the availability of in person appointments. I also discussed with the patient that there may be a patient responsible charge related to this service. The patient expressed understanding and agreed to proceed.   I discussed the assessment and treatment plan with the patient. The patient was provided an opportunity to ask questions and all were answered. The patient agreed with the plan and demonstrated an understanding of the instructions.   The patient was advised to call back or seek an in-person evaluation if the symptoms worsen or if the condition fails to improve as anticipated.  I provided 25 minutes of non-face-to-face time during this encounter.  HISTORY/CURRENT STATUS: HPI For 6 week med check.  Increased Zoloft 6 weeks ago. States he is much better and likes the improvement he's had. His wife has noticed it too. He's been under some temporary stress at work and he's handled it much better than he would have a few months ago. Patient is able to enjoy things.  Energy and motivation are good.  No extreme sadness, tearfulness, or feelings of hopelessness.  Sleeps well most of the time. ADLs and personal hygiene are normal. States he's been scattered brained a few times since increasing the Zoloft but not necessarily related to the change.  "It might just be the stress."   Appetite has  not changed.  Weight is stable.  No PA and generalized anxiety is a lot better. Denies suicidal or homicidal thoughts.  Patient denies increased energy with decreased need for sleep, increased talkativeness, racing thoughts, impulsivity or risky behaviors, increased spending, increased libido, grandiosity, increased irritability or anger, paranoia, or hallucinations.  Denies dizziness, syncope, seizures, numbness, tingling, tremor, tics, unsteady gait, slurred speech, confusion. Denies muscle or joint pain, stiffness, or dystonia.  Individual Medical History/ Review of Systems: Changes? :No   Past Psychiatric History:    Past medications for mental health diagnoses include: Zoloft started 2012   No psych hospitalizations, no suicide attempts   Allergies: Penicillins  Current Medications:  Current Outpatient Medications:    Multiple Vitamin (MULTIVITAMIN) capsule, Take 1 capsule by mouth daily., Disp: , Rfl:    sertraline (ZOLOFT) 100 MG tablet, Take 1.5 tablets (150 mg total) by mouth daily., Disp: 135 tablet, Rfl: 0 Medication Side Effects: none  Family Medical/ Social History: Changes? No  MENTAL HEALTH EXAM:  There were no vitals taken for this visit.There is no height or weight on file to calculate BMI.  General Appearance:  unable to assess  Eye Contact:   unable to assess  Speech:  Clear and Coherent and Normal Rate  Volume:  Normal  Mood:  Euthymic  Affect:   unable to assess  Thought Process:  Goal Directed and Descriptions of Associations: Circumstantial  Orientation:  Full (Time, Place, and Person)  Thought Content: Logical   Suicidal Thoughts:  No  Homicidal Thoughts:  No  Memory:  WNL  Judgement:  Good  Insight:  Good  Psychomotor Activity:   unable to assess  Concentration:  Concentration: Good  Recall:  Good  Fund of Knowledge: Good  Language: Good  Assets:  Communication Skills Desire for Improvement Financial  Resources/Insurance Housing Transportation Vocational/Educational  ADL's:  Intact  Cognition: WNL  Prognosis:  Good   DIAGNOSES:    ICD-10-CM   1. Generalized anxiety disorder  F41.1     2. Mild depression  F32.A      Receiving Psychotherapy: Yes  with Evelena Peat Deussing  RECOMMENDATIONS:  PDMP reviewed. No controlled substances. I provided 25  minutes of non-face-to-face time during this encounter, including time spent before and after the visit in records review, medical decision making, counseling pertinent to today's visit, and charting.   I'm glad he's doing so well! The 'scatterbrained' episodes he's had are likely d/t stress at work but if they worsen or don't improve, will re-eval.  Continue Zoloft 100 mg, 1.5 qd Continue MVI daily. Continue therapy with Evelena Peat Deussing. Return in 3 months.   Donnal Moat, PA-C

## 2022-12-09 DIAGNOSIS — F419 Anxiety disorder, unspecified: Secondary | ICD-10-CM | POA: Diagnosis not present

## 2022-12-16 DIAGNOSIS — F419 Anxiety disorder, unspecified: Secondary | ICD-10-CM | POA: Diagnosis not present

## 2023-01-07 DIAGNOSIS — F419 Anxiety disorder, unspecified: Secondary | ICD-10-CM | POA: Diagnosis not present

## 2023-01-22 ENCOUNTER — Ambulatory Visit (INDEPENDENT_AMBULATORY_CARE_PROVIDER_SITE_OTHER): Payer: BC Managed Care – PPO | Admitting: Physician Assistant

## 2023-01-22 ENCOUNTER — Encounter: Payer: Self-pay | Admitting: Physician Assistant

## 2023-01-22 DIAGNOSIS — F32A Depression, unspecified: Secondary | ICD-10-CM

## 2023-01-22 DIAGNOSIS — F411 Generalized anxiety disorder: Secondary | ICD-10-CM | POA: Diagnosis not present

## 2023-01-22 NOTE — Progress Notes (Signed)
[facility] Med Check  Patient ID: Walter Maldonado,  MRN: [id number]  PCP: Ardith Dark, MD  Date of Evaluation: 01/22/2023 Time spent:20 minutes  Chief Complaint:  Chief Complaint   Follow-up    HISTORY/CURRENT STATUS: HPI For 6 week med check.  Since increasing Zoloft 3 months ago, anxiety has been better. Still has times where he feels overwhelmed, gets frustrated easily. His therapist, Kelton Pillar, disc w/ him possibility of mood d/o.  Still has a lot of probs with focus.  Has appt w/ [facility] in June, hoping to get some answers there.   Patient is able to enjoy things.  Energy and motivation are good.  Work is going well.   No extreme sadness, tearfulness, or feelings of hopelessness.  Sleeps well most of the time. ADLs and personal hygiene are normal.   Appetite has not changed.  Weight is stable. Denies suicidal or homicidal thoughts.  Patient denies increased energy with decreased need for sleep, increased talkativeness, racing thoughts, impulsivity or risky behaviors, increased spending, increased libido, grandiosity, paranoia, or hallucinations.  Denies dizziness, syncope, seizures, numbness, tingling, tremor, tics, unsteady gait, slurred speech, confusion. Denies muscle or joint pain, stiffness, or dystonia.  Individual Medical History/ Review of Systems: Changes? :No   Past Psychiatric History:    Past medications for mental health diagnoses include: Zoloft started 2012   No psych hospitalizations, no suicide attempts   Allergies: Penicillins  Current Medications:  Current Outpatient Medications:    Multiple Vitamin (MULTIVITAMIN) capsule, Take 1 capsule by mouth daily., Disp: , Rfl:    sertraline (ZOLOFT) 100 MG tablet, Take 1.5 tablets (150 mg total) by mouth daily., Disp: 135 tablet, Rfl: 0 Medication Side Effects: none  Family Medical/ Social History: Changes? No  MENTAL HEALTH EXAM:  There were no vitals taken for this  visit.There is no height or weight on file to calculate BMI.  General Appearance: Casual and Well Groomed  Eye Contact:  Good  Speech:  Clear and Coherent and Normal Rate  Volume:  Normal  Mood:  Euthymic  Affect:  Congruent  Thought Process:  Goal Directed and Descriptions of Associations: Circumstantial  Orientation:  Full (Time, Place, and Person)  Thought Content: Logical   Suicidal Thoughts:  No  Homicidal Thoughts:  No  Memory:  WNL  Judgement:  Good  Insight:  Good  Psychomotor Activity:  Normal  Concentration:  Concentration: Good  Recall:  Good  Fund of Knowledge: Good  Language: Good  Assets:  Communication Skills Desire for Improvement Financial Resources/Insurance Housing Transportation Vocational/Educational  ADL's:  Intact  Cognition: WNL  Prognosis:  Good   DIAGNOSES:    ICD-10-CM   1. Generalized anxiety disorder  F41.1     2. Mild depression  F32.A       Receiving Psychotherapy: Yes  with [name]  RECOMMENDATIONS:  PDMP reviewed. No controlled substances. I provided 20 minutes of face to face time during this encounter, including time spent before and after the visit in records review, medical decision making, counseling pertinent to today's visit, and charting.   We discussed his symptoms.  He very well could have a mood disorder, however I do not want to diagnose that without having more information.  Specifically his evaluation at [facility].  If he does have ADD/ADHD, that can explain his temperament.  He agrees with waiting on that appointment before making any changes.  Continue Zoloft 100 mg, 1.5 qd Continue MVI daily. Continue therapy with Brayton Caves  Deussing. Return in 2 months.   Melony Overly, PA-C

## 2023-01-28 DIAGNOSIS — F419 Anxiety disorder, unspecified: Secondary | ICD-10-CM | POA: Diagnosis not present

## 2023-02-10 DIAGNOSIS — F419 Anxiety disorder, unspecified: Secondary | ICD-10-CM | POA: Diagnosis not present

## 2023-02-17 DIAGNOSIS — F419 Anxiety disorder, unspecified: Secondary | ICD-10-CM | POA: Diagnosis not present

## 2023-02-24 DIAGNOSIS — F419 Anxiety disorder, unspecified: Secondary | ICD-10-CM | POA: Diagnosis not present

## 2023-03-10 DIAGNOSIS — F419 Anxiety disorder, unspecified: Secondary | ICD-10-CM | POA: Diagnosis not present

## 2023-03-14 ENCOUNTER — Other Ambulatory Visit: Payer: Self-pay | Admitting: Physician Assistant

## 2023-03-17 DIAGNOSIS — F419 Anxiety disorder, unspecified: Secondary | ICD-10-CM | POA: Diagnosis not present

## 2023-03-19 DIAGNOSIS — R41840 Attention and concentration deficit: Secondary | ICD-10-CM | POA: Diagnosis not present

## 2023-03-19 DIAGNOSIS — F419 Anxiety disorder, unspecified: Secondary | ICD-10-CM | POA: Diagnosis not present

## 2023-03-20 DIAGNOSIS — R41840 Attention and concentration deficit: Secondary | ICD-10-CM | POA: Diagnosis not present

## 2023-03-20 DIAGNOSIS — Z79899 Other long term (current) drug therapy: Secondary | ICD-10-CM | POA: Diagnosis not present

## 2023-03-23 ENCOUNTER — Encounter: Payer: Self-pay | Admitting: Physician Assistant

## 2023-03-23 ENCOUNTER — Ambulatory Visit (INDEPENDENT_AMBULATORY_CARE_PROVIDER_SITE_OTHER): Payer: BC Managed Care – PPO | Admitting: Physician Assistant

## 2023-03-23 DIAGNOSIS — F902 Attention-deficit hyperactivity disorder, combined type: Secondary | ICD-10-CM

## 2023-03-23 DIAGNOSIS — F411 Generalized anxiety disorder: Secondary | ICD-10-CM | POA: Diagnosis not present

## 2023-03-23 DIAGNOSIS — F32A Depression, unspecified: Secondary | ICD-10-CM | POA: Diagnosis not present

## 2023-03-23 MED ORDER — LISDEXAMFETAMINE DIMESYLATE 20 MG PO CAPS: 20.0000 mg | ORAL_CAPSULE | Freq: Every day | ORAL | 0 refills | Status: DC

## 2023-03-23 NOTE — Progress Notes (Signed)
[facility]  Patient ID: Walter Maldonado,  MRN: [id number]  PCP: Walter Dark, MD  Date of Evaluation: 03/23/2023 Time spent:30 minutes  Chief Complaint:  Chief Complaint   Anxiety; Depression; Follow-up    HISTORY/CURRENT STATUS: HPI For 6 week med check. Wife, Walter Maldonado, is with him.   He had testing for ADHD at [name]. States he aced it, nothing to point toward attn deficit. He has always been a high achiever but feels like he has to work so hard to accomplish things, he'll get very aggravated if/when he's interrupted and has a hard time getting back into a project. He does get distracted easily when really tired, like when he gets home from work, Walter Maldonado feels like he's given everything he has to his job and then by the time he gets home, he can't give anymore.  That irritates him but he doesn't feel like he can do anything about it. Does have some mood swings but as described, feels like it's triggered. Patient denies increased energy with decreased need for sleep, increased talkativeness, racing thoughts, impulsivity or risky behaviors, increased spending, in fact he hates spending money, increased libido, grandiosity, paranoia, or hallucinations.  He's been on Zoloft for years and doesn't feel like it's ever helped in any way. Even with recent increase.  Patient is able to enjoy things.  Energy and motivation are good.  No extreme sadness, tearfulness, or feelings of hopelessness.  Sleeps well. He doesn't do well if he misses sleep.  ADLs and personal hygiene are normal.  Appetite has not changed.  Weight is stable.  Denies suicidal or homicidal thoughts.  Denies dizziness, syncope, seizures, numbness, tingling, tremor, tics, unsteady gait, slurred speech, confusion. Denies muscle or joint pain, stiffness, or dystonia.  Individual Medical History/ Review of Systems: Changes? :No   Past Psychiatric History:    Past medications for mental health diagnoses  include: Zoloft started 2012   No psych hospitalizations, no suicide attempts   Allergies: Penicillins  Current Medications:  Current Outpatient Medications:    lisdexamfetamine (VYVANSE) 20 MG capsule, Take 1 capsule (20 mg total) by mouth daily., Disp: 30 capsule, Rfl: 0   Multiple Vitamin (MULTIVITAMIN) capsule, Take 1 capsule by mouth daily., Disp: , Rfl:    sertraline (ZOLOFT) 100 MG tablet, TAKE 1.5 TABLETS (150MG  TOTAL) BY MOUTH DAILY, Disp: 135 tablet, Rfl: 0 Medication Side Effects: none  Family Medical/ Social History: Changes? No  MENTAL HEALTH EXAM:  There were no vitals taken for this visit.There is no height or weight on file to calculate BMI.  General Appearance: Casual and Well Groomed  Eye Contact:  Good  Speech:  Clear and Coherent and Normal Rate  Volume:  Normal  Mood:   sad  Affect:  Congruent  Thought Process:  Goal Directed and Descriptions of Associations: Circumstantial  Orientation:  Full (Time, Place, and Person)  Thought Content: Logical   Suicidal Thoughts:  No  Homicidal Thoughts:  No  Memory:  WNL  Judgement:  Good  Insight:  Good  Psychomotor Activity:  Normal  Concentration:  Concentration: Good and Attention Span: Fair  Recall:  Good  Fund of Knowledge: Good  Language: Good  Assets:  Communication Skills Desire for Improvement Financial Resources/Insurance Housing Transportation Vocational/Educational  ADL's:  Intact  Cognition: WNL  Prognosis:  Good   DIAGNOSES:    ICD-10-CM   1. Attention deficit hyperactivity disorder (ADHD), combined type  F90.2     2. Generalized anxiety disorder  F41.1     3. Mild depression  F32.A      Receiving Psychotherapy: Yes  with Walter Maldonado  RECOMMENDATIONS:  PDMP reviewed. No controlled substances. I provided 30 minutes of face to face time during this encounter, including time spent before and after the visit in records review, medical decision making, counseling pertinent to today's  visit, and charting.   Long discussion about dx and tx options. After getting collateral info from Walter Maldonado, his behaviors seem like ADHD combined type. Even though the formal testing didn't point that way. We discussed the possibility of Bip II d/o, I explained that sometimes sx can overlap and it's hard to differentiate between the dx. I feel like treating the ADHD is most important now and will watch for Bip sx. I provided 20 mins of counseling concerning med options for ADHD. Stimulants, Strattera, Quelbree, Clonidine, Guanfacine all disc. He would like to start a stimulant.  Discussed amphetamines vs methylphenidates.  We agreed on Vyvanse, he understands it may be difficult to find.  If generic not available,will try for brand but if too expensive, will go to Adderall XR.  Discussed potential benefits, risks, and side effects of stimulants with patient to include increased heart rate, palpitations, insomnia, increased anxiety, increased irritability, or decreased appetite.  The patient understands and accepts these risks.  Instructed patient to contact office if experiencing any significant tolerability issues.  For now, cont Zoloft, but will plan to wean off after we get the tx for ADHD nailed down.  Start Vyvanse 20 mg, 1 q am. Ok to skip weekend if he wants.  Continue Zoloft 100 mg, 1.5 qd Continue MVI daily. Continue therapy with Walter Maldonado. Return in 4 weeks.  Walter Overly, PA-C

## 2023-03-24 ENCOUNTER — Ambulatory Visit: Payer: BC Managed Care – PPO | Admitting: Physician Assistant

## 2023-03-25 DIAGNOSIS — F419 Anxiety disorder, unspecified: Secondary | ICD-10-CM | POA: Diagnosis not present

## 2023-04-01 DIAGNOSIS — F419 Anxiety disorder, unspecified: Secondary | ICD-10-CM | POA: Diagnosis not present

## 2023-04-24 ENCOUNTER — Ambulatory Visit (INDEPENDENT_AMBULATORY_CARE_PROVIDER_SITE_OTHER): Payer: BC Managed Care – PPO | Admitting: Physician Assistant

## 2023-04-24 DIAGNOSIS — F32A Depression, unspecified: Secondary | ICD-10-CM

## 2023-04-24 DIAGNOSIS — F411 Generalized anxiety disorder: Secondary | ICD-10-CM | POA: Diagnosis not present

## 2023-04-24 DIAGNOSIS — F902 Attention-deficit hyperactivity disorder, combined type: Secondary | ICD-10-CM

## 2023-04-29 ENCOUNTER — Encounter: Payer: Self-pay | Admitting: Physician Assistant

## 2023-04-29 NOTE — Progress Notes (Signed)
[facility]  Patient ID: Walter Maldonado,  MRN: [id number]  PCP: Ardith Dark, MD  Date of Evaluation: 04/24/2023 Time spent:20 minutes  Chief Complaint:  Chief Complaint   Follow-up    HISTORY/CURRENT STATUS: HPI for 1 month med check.  We started Vyvanse last month.  For the first 2 weeks it worked great.  He was much more able to stay on task and get things done.  He felt really good about it.  The last 2 weeks however he has been more like a "zombie" not feeling like or wanting to play with his kids or being engaged with family or friends.  He is concerned that it may continue.  He is still able to focus and concentrate and is getting work done but just does not like that side effects.  We have talked several times about this Zoloft.  He has been on it for a long time, does not really feel that it has ever done thing to help with his mood or anxiety.  He would like to go off of it if that is appropriate, to get that out of the equation.  Patient is able to enjoy things.  Energy and motivation are fair to good.  Work is going well.   No extreme sadness, tearfulness, or feelings of hopelessness.  Sleeps well most of the time. ADLs and personal hygiene are normal.  No change in memory.  Appetite has not changed.  Weight is stable.  Reports no anxiety.  Denies suicidal or homicidal thoughts.  Patient denies increased energy with decreased need for sleep, increased talkativeness, racing thoughts, impulsivity or risky behaviors, increased spending, increased libido, grandiosity, increased irritability or anger, paranoia, or hallucinations.  Denies dizziness, syncope, seizures, numbness, tingling, tremor, tics, unsteady gait, slurred speech, confusion. Denies muscle or joint pain, stiffness, or dystonia.  Individual Medical History/ Review of Systems: Changes? :No   Past Psychiatric History:    Past medications for mental health diagnoses include: Zoloft started 2012   No  psych hospitalizations, no suicide attempts   Allergies: Penicillins  Current Medications:  Current Outpatient Medications:    lisdexamfetamine (VYVANSE) 20 MG capsule, Take 1 capsule (20 mg total) by mouth daily., Disp: 30 capsule, Rfl: 0   Multiple Vitamin (MULTIVITAMIN) capsule, Take 1 capsule by mouth daily., Disp: , Rfl:    sertraline (ZOLOFT) 100 MG tablet, TAKE 1.5 TABLETS (150MG  TOTAL) BY MOUTH DAILY, Disp: 135 tablet, Rfl: 0 Medication Side Effects: none  Family Medical/ Social History: Changes? No  MENTAL HEALTH EXAM:  There were no vitals taken for this visit.There is no height or weight on file to calculate BMI.  General Appearance: Casual and Well Groomed  Eye Contact:  Good  Speech:  Clear and Coherent and Normal Rate  Volume:  Normal  Mood:  Euthymic  Affect:  Congruent  Thought Process:  Goal Directed and Descriptions of Associations: Circumstantial  Orientation:  Full (Time, Place, and Person)  Thought Content: Logical   Suicidal Thoughts:  No  Homicidal Thoughts:  No  Memory:  WNL  Judgement:  Good  Insight:  Good  Psychomotor Activity:  Normal  Concentration:  Concentration: Good and Attention Span: Good  Recall:  Good  Fund of Knowledge: Good  Language: Good  Assets:  Communication Skills Desire for Improvement Financial Resources/Insurance Housing Transportation Vocational/Educational  ADL's:  Intact  Cognition: WNL  Prognosis:  Good   DIAGNOSES:    ICD-10-CM   1. Attention deficit hyperactivity disorder (ADHD), combined

## 2023-04-30 ENCOUNTER — Encounter: Payer: Self-pay | Admitting: Physician Assistant

## 2023-04-30 NOTE — Progress Notes (Deleted)
[facility]  Patient ID: Walter Maldonado,  MRN: [id number]  PCP: Ardith Dark, MD  Date of Evaluation: 04/24/2023 Time spent:20 minutes  Chief Complaint:  Chief Complaint   Follow-up    HISTORY/CURRENT STATUS: HPI for 1 month med check.  We started Vyvanse last month.  For the first 2 weeks it worked great.  He was much more able to stay on task and get things done.  He felt really good about it.  The last 2 weeks however he has been more like a "zombie" not feeling like or wanting to play with his kids or being engaged with family or friends.  He is concerned that it may continue.  He is still able to focus and concentrate and is getting work done but just does not like that side effects.  We have talked several times about this Zoloft.  He has been on it for a long time, does not really feel that it has ever done thing to help with his mood or anxiety.  He would like to go off of it if that is appropriate, to get that out of the equation.  Patient is able to enjoy things.  Energy and motivation are fair to good.  Work is going well.   No extreme sadness, tearfulness, or feelings of hopelessness.  Sleeps well most of the time. ADLs and personal hygiene are normal.  No change in memory.  Appetite has not changed.  Weight is stable.  Reports no anxiety.  Denies suicidal or homicidal thoughts.  Patient denies increased energy with decreased need for sleep, increased talkativeness, racing thoughts, impulsivity or risky behaviors, increased spending, increased libido, grandiosity, increased irritability or anger, paranoia, or hallucinations.  Denies dizziness, syncope, seizures, numbness, tingling, tremor, tics, unsteady gait, slurred speech, confusion. Denies muscle or joint pain, stiffness, or dystonia.  Individual Medical History/ Review of Systems: Changes? :No   Past Psychiatric History:    Past medications for mental health diagnoses include: Zoloft started 2012   No  psych hospitalizations, no suicide attempts   Allergies: Penicillins  Current Medications:  Current Outpatient Medications:    lisdexamfetamine (VYVANSE) 20 MG capsule, Take 1 capsule (20 mg total) by mouth daily., Disp: 30 capsule, Rfl: 0   Multiple Vitamin (MULTIVITAMIN) capsule, Take 1 capsule by mouth daily., Disp: , Rfl:    sertraline (ZOLOFT) 100 MG tablet, TAKE 1.5 TABLETS (150MG  TOTAL) BY MOUTH DAILY, Disp: 135 tablet, Rfl: 0 Medication Side Effects: none  Family Medical/ Social History: Changes? No  MENTAL HEALTH EXAM:  There were no vitals taken for this visit.There is no height or weight on file to calculate BMI.  General Appearance: Casual and Well Groomed  Eye Contact:  Good  Speech:  Clear and Coherent and Normal Rate  Volume:  Normal  Mood:  Euthymic  Affect:  Congruent  Thought Process:  Goal Directed and Descriptions of Associations: Circumstantial  Orientation:  Full (Time, Place, and Person)  Thought Content: Logical   Suicidal Thoughts:  No  Homicidal Thoughts:  No  Memory:  WNL  Judgement:  Good  Insight:  Good  Psychomotor Activity:  Normal  Concentration:  Concentration: Good and Attention Span: Good  Recall:  Good  Fund of Knowledge: Good  Language: Good  Assets:  Communication Skills Desire for Improvement Financial Resources/Insurance Housing Transportation Vocational/Educational  ADL's:  Intact  Cognition: WNL  Prognosis:  Good   DIAGNOSES:    ICD-10-CM   1. Attention deficit hyperactivity disorder (ADHD), combined  type  F90.2     2. Generalized anxiety disorder  F41.1     3. Mild depression  F32.A      Receiving Psychotherapy: Yes  with [name]  RECOMMENDATIONS:  PDMP reviewed.  Vyvanse filled 03/25/2023. I provided 20 minutes of face to face time during this encounter, including time spent before and after the visit in records review, medical decision making, counseling pertinent to today's visit, and charting.   I am  glad to see that he is doing better as far as focus and attention goes.  It could be that the Vyvanse dose is a little too high so we could decrease it at the next visit.  He understands and agrees that I do not want to make more than 1 change at a time.  We agree that it is a good time to start weaning off the Zoloft to see how he does without it.  That in itself could be making him feel like a zombie especially in combination with the Vyvanse.  Wean Zoloft 100 mg by taking 100 mg daily for 1 week, then 50 mg daily for 1 week and then stop.  If he has any withdrawals such as dizziness, brain zaps, nausea or others he can call and I will will send in a prescription to wean down by 25 mg each week.  He understands.  Continue Vyvanse 20 mg, 1 q am.   Continue MVI daily. Continue therapy with [name]. Return in 4 weeks.  Melony Overly, PA-C

## 2023-04-30 NOTE — Progress Notes (Signed)
[facility]  Patient ID: Walter Maldonado,  MRN: [id number]  PCP: Ardith Dark, MD  Date of Evaluation: 04/24/2023 Time spent:20 minutes  Chief Complaint:  Chief Complaint   Follow-up    HISTORY/CURRENT STATUS: HPI for 1 month med check.  We started Vyvanse last month.  For the first 2 weeks it worked great.  He was much more able to stay on task and get things done.  He felt really good about it.  The last 2 weeks however he has been more like a "zombie" not feeling like or wanting to play with his kids or being engaged with family or friends.  He is concerned that it may continue.  He is still able to focus and concentrate and is getting work done but just does not like that side effects.  We have talked several times about this Zoloft.  He has been on it for a long time, does not really feel that it has ever done thing to help with his mood or anxiety.  He would like to go off of it if that is appropriate, to get that out of the equation.  Patient is able to enjoy things.  Energy and motivation are fair to good.  Work is going well.   No extreme sadness, tearfulness, or feelings of hopelessness.  Sleeps well most of the time. ADLs and personal hygiene are normal.  No change in memory.  Appetite has not changed.  Weight is stable.  Reports no anxiety.  Denies suicidal or homicidal thoughts.  Patient denies increased energy with decreased need for sleep, increased talkativeness, racing thoughts, impulsivity or risky behaviors, increased spending, increased libido, grandiosity, increased irritability or anger, paranoia, or hallucinations.  Denies dizziness, syncope, seizures, numbness, tingling, tremor, tics, unsteady gait, slurred speech, confusion. Denies muscle or joint pain, stiffness, or dystonia.  Individual Medical History/ Review of Systems: Changes? :No   Past Psychiatric History:    Past medications for mental health diagnoses include: Zoloft started 2012   No  psych hospitalizations, no suicide attempts   Allergies: Penicillins  Current Medications:  Current Outpatient Medications:    lisdexamfetamine (VYVANSE) 20 MG capsule, Take 1 capsule (20 mg total) by mouth daily., Disp: 30 capsule, Rfl: 0   Multiple Vitamin (MULTIVITAMIN) capsule, Take 1 capsule by mouth daily., Disp: , Rfl:    sertraline (ZOLOFT) 100 MG tablet, TAKE 1.5 TABLETS (150MG  TOTAL) BY MOUTH DAILY, Disp: 135 tablet, Rfl: 0 Medication Side Effects: none  Family Medical/ Social History: Changes? No  MENTAL HEALTH EXAM:  There were no vitals taken for this visit.There is no height or weight on file to calculate BMI.  General Appearance: Casual and Well Groomed  Eye Contact:  Good  Speech:  Clear and Coherent and Normal Rate  Volume:  Normal  Mood:  Euthymic  Affect:  Congruent  Thought Process:  Goal Directed and Descriptions of Associations: Circumstantial  Orientation:  Full (Time, Place, and Person)  Thought Content: Logical   Suicidal Thoughts:  No  Homicidal Thoughts:  No  Memory:  WNL  Judgement:  Good  Insight:  Good  Psychomotor Activity:  Normal  Concentration:  Concentration: Good and Attention Span: Good  Recall:  Good  Fund of Knowledge: Good  Language: Good  Assets:  Communication Skills Desire for Improvement Financial Resources/Insurance Housing Transportation Vocational/Educational  ADL's:  Intact  Cognition: WNL  Prognosis:  Good   DIAGNOSES:    ICD-10-CM   1. Attention deficit hyperactivity disorder (ADHD), combined  type  F90.2     2. Generalized anxiety disorder  F41.1     3. Mild depression  F32.A      Receiving Psychotherapy: Yes  with [name]  RECOMMENDATIONS:  PDMP reviewed.  Vyvanse filled 03/25/2023. I provided 20 minutes of face to face time during this encounter, including time spent before and after the visit in records review, medical decision making, counseling pertinent to today's visit, and charting.   I am  glad to see that he is doing better as far as focus and attention goes.  It could be that the Vyvanse dose is a little too high so we could decrease it at the next visit.  He understands and agrees that I do not want to make more than 1 change at a time.  We agree that it is a good time to start weaning off the Zoloft to see how he does without it.  That in itself could be making him feel like a zombie especially in combination with the Vyvanse.  Wean Zoloft 100 mg by taking 100 mg daily for 1 week, then 50 mg daily for 1 week and then stop.  If he has any withdrawals such as dizziness, brain zaps, nausea or others he can call and I will will send in a prescription to wean down by 25 mg each week.  He understands.  Continue Vyvanse 20 mg, 1 q am.   Continue MVI daily. Continue therapy with [name]. Return in 4 weeks.  Melony Overly, PA-C

## 2023-05-14 ENCOUNTER — Telehealth: Payer: Self-pay | Admitting: Physician Assistant

## 2023-05-14 NOTE — Telephone Encounter (Signed)
Next visit is 05/29/23. Requesting to taper off of generic Zoloft. Please call him at [phone].  Pharmacy is:  [id number] - [place],  - [address]   Phone: [phone]  Fax: [phone]

## 2023-05-14 NOTE — Telephone Encounter (Signed)
Patient called about tapering off Zoloft. From 7/19 visit:  Wean Zoloft 100 mg by taking 100 mg daily for 1 week, then 50 mg daily for 1 week and then stop. If he has any withdrawals such as dizziness, brain zaps, nausea or others he can call and I will will send in a prescription to wean down by 25 mg each week. He understands. Patient reports not able to wean off and has restarted at 50 mg. He said he had been on it for 12 years. He said last appt was set thinking he would be completely off the medication. He is scheduled for 8/23 and wants to push out his appt.

## 2023-05-18 ENCOUNTER — Other Ambulatory Visit: Payer: Self-pay | Admitting: Physician Assistant

## 2023-05-18 MED ORDER — SERTRALINE HCL 25 MG PO TABS: ORAL_TABLET | ORAL | 0 refills | Status: DC

## 2023-05-18 NOTE — Telephone Encounter (Signed)
Ok to r/s appt to late Sept or early Oct.  Let's decrease slower, I'll send in Rx for 25 mg. He'll take 50 mg for 2 weeks, then the 25 mg 1.5 pills (37.5 mg) for 2 weeks, then 25 mg for 2 weeks, then 1/2 pill (12.5 mg) for 2 weeks, then stop.

## 2023-05-18 NOTE — Telephone Encounter (Signed)
Patient notified of recommendations. He asked for a RF on generic Vyvanse, but he isn't due until 8/17, Last filled 7/19.

## 2023-05-19 NOTE — Telephone Encounter (Signed)
LVM to RC in regards to Vyvanse RF.

## 2023-05-22 ENCOUNTER — Other Ambulatory Visit: Payer: Self-pay

## 2023-05-22 MED ORDER — LISDEXAMFETAMINE DIMESYLATE 20 MG PO CAPS: 20.0000 mg | ORAL_CAPSULE | Freq: Every day | ORAL | 0 refills | Status: DC

## 2023-05-22 NOTE — Telephone Encounter (Signed)
Pended Vyvanse 

## 2023-05-29 ENCOUNTER — Ambulatory Visit: Payer: BC Managed Care – PPO | Admitting: Physician Assistant

## 2023-06-23 ENCOUNTER — Telehealth: Payer: Self-pay | Admitting: Physician Assistant

## 2023-06-23 ENCOUNTER — Other Ambulatory Visit: Payer: Self-pay

## 2023-06-23 MED ORDER — LISDEXAMFETAMINE DIMESYLATE 20 MG PO CAPS: 20.0000 mg | ORAL_CAPSULE | Freq: Every day | ORAL | 0 refills | Status: DC

## 2023-06-23 NOTE — Telephone Encounter (Signed)
Pended.

## 2023-06-23 NOTE — Telephone Encounter (Signed)
Pt called asking for a refill on his vyvanse 20 mg. Pharmacy is [facility] in [place]. He has an appt 10/7

## 2023-07-13 ENCOUNTER — Encounter: Payer: Self-pay | Admitting: Physician Assistant

## 2023-07-13 ENCOUNTER — Ambulatory Visit: Payer: BC Managed Care – PPO | Admitting: Physician Assistant

## 2023-07-13 DIAGNOSIS — F902 Attention-deficit hyperactivity disorder, combined type: Secondary | ICD-10-CM | POA: Diagnosis not present

## 2023-07-13 DIAGNOSIS — F32A Depression, unspecified: Secondary | ICD-10-CM | POA: Diagnosis not present

## 2023-07-13 DIAGNOSIS — F411 Generalized anxiety disorder: Secondary | ICD-10-CM | POA: Diagnosis not present

## 2023-07-13 MED ORDER — LISDEXAMFETAMINE DIMESYLATE 30 MG PO CAPS: 30.0000 mg | ORAL_CAPSULE | Freq: Every day | ORAL | 0 refills | Status: DC

## 2023-07-13 NOTE — Progress Notes (Signed)
[facility]  Patient ID: Walter Maldonado,  MRN: [id number]  PCP: Ardith Dark, MD  Date of Evaluation: 07/13/2023 Time spent:20 minutes  Chief Complaint:  Chief Complaint   Depression; ADD    HISTORY/CURRENT STATUS: HPI for routine med check.  We weaned off Zoloft at the last visit.  We decreased slowly but he did not tolerate going off completely.  He restarted it at 50 mg and has been less irritable and his fuse is not as short.  He feels like things could be better though.  He still has trouble focusing and staying on task.  He still gets distracted easily.  He tends to be late a lot because he loses track of time.  Patient is able to enjoy things.  Energy and motivation are good.  He is excited about going to [place] on a mission trip at the end of this month.  Work is going well.   No extreme sadness, tearfulness, or feelings of hopelessness.  Sleeps well most of the time. ADLs and personal hygiene are normal.   Appetite has not changed.  Weight is stable.  Not having anxiety to speak of.  When he forgets things or is late that does cause anxiety though.                                                              Patient denies increased energy with decreased need for sleep, increased talkativeness, racing thoughts, impulsivity or risky behaviors, increased spending, increased libido, grandiosity, increased irritability or anger, paranoia, or hallucinations.  Denies dizziness, syncope, seizures, numbness, tingling, tremor, tics, unsteady gait, slurred speech, confusion. Denies muscle or joint pain, stiffness, or dystonia.  Individual Medical History/ Review of Systems: Changes? :No   Past Psychiatric History:    Past medications for mental health diagnoses include: Zoloft started 2012   No psych hospitalizations, no suicide attempts   Allergies: Penicillins  Current Medications:  Current Outpatient Medications:    [START ON 07/22/2023] lisdexamfetamine  (VYVANSE) 30 MG capsule, Take 1 capsule (30 mg total) by mouth daily., Disp: 30 capsule, Rfl: 0   Multiple Vitamin (MULTIVITAMIN) capsule, Take 1 capsule by mouth daily., Disp: , Rfl:    sertraline (ZOLOFT) 25 MG tablet, Take 2 p.o. daily for 2 weeks, then decrease to 1.5 pills (37.5 mg) daily for 2 weeks, then 1 p.o. daily for 2 weeks, and then 1/2 pill (12.5 mg) daily for 2 weeks and then stop. (Patient taking differently: Take 50 mg by mouth daily.), Disp: 70 tablet, Rfl: 0 Medication Side Effects: none  Family Medical/ Social History: Changes? No  MENTAL HEALTH EXAM:  There were no vitals taken for this visit.There is no height or weight on file to calculate BMI.  General Appearance: Casual and Well Groomed  Eye Contact:  Good  Speech:  Clear and Coherent and Normal Rate  Volume:  Normal  Mood:  Euthymic  Affect:  Congruent  Thought Process:  Goal Directed and Descriptions of Associations: Circumstantial  Orientation:  Full (Time, Place, and Person)  Thought Content: Logical   Suicidal Thoughts:  No  Homicidal Thoughts:  No  Memory:  WNL  Judgement:  Good  Insight:  Good  Psychomotor Activity:  Normal  Concentration:  Concentration: Fair and Attention Span: Fair  Recall:  [place] Major of Knowledge: Good  Language: Good  Assets:  Communication Skills Desire for Improvement Financial Resources/Insurance Housing Transportation Vocational/Educational  ADL's:  Intact  Cognition: WNL  Prognosis:  Good   DIAGNOSES:    ICD-10-CM   1. Attention deficit hyperactivity disorder (ADHD), combined type  F90.2     2. Mild depression  F32.A     3. Generalized anxiety disorder  F41.1      Receiving Psychotherapy: Yes  with [name]  RECOMMENDATIONS:  PDMP reviewed.  Vyvanse filled 06/23/2023. I provided 20 minutes of face to face time during this encounter, including time spent before and after the visit in records review, medical decision making, counseling pertinent to  today's visit, and charting.   We discussed his symptoms.  I recommend increasing the Vyvanse.  We may need to increase the Zoloft but we will discuss at the next visit.  Increase Vyvanse to 30 mg, 1 q am.   Continue Zoloft 50 mg, 1 p.o. daily. Continue MVI daily. Continue therapy with [name]. Return in 4 weeks.  Melony Overly, PA-C

## 2023-07-17 ENCOUNTER — Other Ambulatory Visit: Payer: Self-pay | Admitting: Physician Assistant

## 2023-07-17 NOTE — Telephone Encounter (Signed)
He was supposed to taper off. LVM to [facility] and let us know if he had done this.

## 2023-07-20 NOTE — Telephone Encounter (Signed)
Left second VM to [facility].

## 2023-07-21 DIAGNOSIS — G8929 Other chronic pain: Secondary | ICD-10-CM | POA: Diagnosis not present

## 2023-07-21 DIAGNOSIS — M25511 Pain in right shoulder: Secondary | ICD-10-CM | POA: Diagnosis not present

## 2023-07-24 ENCOUNTER — Other Ambulatory Visit: Payer: Self-pay

## 2023-07-24 MED ORDER — SERTRALINE HCL 50 MG PO TABS: 50.0000 mg | ORAL_TABLET | Freq: Every day | ORAL | 0 refills | Status: DC

## 2023-07-24 NOTE — Telephone Encounter (Signed)
Left another VM. How many tablets is he taking?

## 2023-08-18 ENCOUNTER — Ambulatory Visit: Payer: BC Managed Care – PPO | Admitting: Physician Assistant

## 2023-08-18 ENCOUNTER — Other Ambulatory Visit: Payer: Self-pay

## 2023-08-18 ENCOUNTER — Telehealth: Payer: Self-pay | Admitting: Physician Assistant

## 2023-08-18 NOTE — Telephone Encounter (Signed)
Pt called back to rs his appt that was cancelled today with teresa. He needs a refill on his vyvanse  30 mg . [facility] [address] [place]

## 2023-08-18 NOTE — Telephone Encounter (Signed)
Pended. Not due until tomorrow. Changed the start date.

## 2023-08-19 MED ORDER — LISDEXAMFETAMINE DIMESYLATE 30 MG PO CAPS: 30.0000 mg | ORAL_CAPSULE | Freq: Every day | ORAL | 0 refills | Status: DC

## 2023-09-16 ENCOUNTER — Encounter: Payer: Self-pay | Admitting: Physician Assistant

## 2023-09-16 ENCOUNTER — Ambulatory Visit: Payer: BC Managed Care – PPO | Admitting: Physician Assistant

## 2023-09-16 DIAGNOSIS — F3341 Major depressive disorder, recurrent, in partial remission: Secondary | ICD-10-CM

## 2023-09-16 DIAGNOSIS — F902 Attention-deficit hyperactivity disorder, combined type: Secondary | ICD-10-CM

## 2023-09-16 MED ORDER — LISDEXAMFETAMINE DIMESYLATE 30 MG PO CAPS: 30.0000 mg | ORAL_CAPSULE | Freq: Every day | ORAL | 0 refills | Status: DC

## 2023-09-16 MED ORDER — LISDEXAMFETAMINE DIMESYLATE 30 MG PO CAPS
30.0000 mg | ORAL_CAPSULE | Freq: Every day | ORAL | 0 refills | Status: DC
Start: 2023-11-15 — End: 2023-12-24

## 2023-09-16 MED ORDER — SERTRALINE HCL 50 MG PO TABS: 50.0000 mg | ORAL_TABLET | Freq: Every day | ORAL | 1 refills | Status: DC

## 2023-09-16 NOTE — Progress Notes (Signed)
[facility]  Patient ID: Walter Maldonado,  MRN: [id number]  PCP: Ardith Dark, MD  Date of Evaluation: 09/16/2023 Time spent:20 minutes  Chief Complaint:  Chief Complaint   Depression; ADD; Follow-up    HISTORY/CURRENT STATUS: HPI for routine med check.  He's doing really well. Feels like the meds are the perfect doses. He feels much better.  Patient is able to enjoy things.  Energy and motivation are good.  Work is going well.   No extreme sadness, tearfulness, or feelings of hopelessness.  Sleeps well most of the time. ADLs and personal hygiene are normal.   Appetite has not changed.  Weight is stable.  No c/o anxiety.   Denies suicidal or homicidal thoughts.  States that attention is good without easy distractibility.  Able to focus on things and finish tasks to completion.   Patient denies increased energy with decreased need for sleep, increased talkativeness, racing thoughts, impulsivity or risky behaviors, increased spending, increased libido, grandiosity, increased irritability or anger, paranoia, or hallucinations.  Denies dizziness, syncope, seizures, numbness, tingling, tremor, tics, unsteady gait, slurred speech, confusion. Denies muscle or joint pain, stiffness, or dystonia.  Individual Medical History/ Review of Systems: Changes? :No   Past Psychiatric History:    Past medications for mental health diagnoses include: Zoloft started 2012   No psych hospitalizations, no suicide attempts   Allergies: Penicillins  Current Medications:  Current Outpatient Medications:    [START ON 10/16/2023] lisdexamfetamine (VYVANSE) 30 MG capsule, Take 1 capsule (30 mg total) by mouth daily., Disp: 30 capsule, Rfl: 0   [START ON 11/15/2023] lisdexamfetamine (VYVANSE) 30 MG capsule, Take 1 capsule (30 mg total) by mouth daily., Disp: 30 capsule, Rfl: 0   Multiple Vitamin (MULTIVITAMIN) capsule, Take 1 capsule by mouth daily., Disp: , Rfl:    lisdexamfetamine (VYVANSE)  30 MG capsule, Take 1 capsule (30 mg total) by mouth daily., Disp: 30 capsule, Rfl: 0   sertraline (ZOLOFT) 50 MG tablet, Take 1 tablet (50 mg total) by mouth daily., Disp: 90 tablet, Rfl: 1 Medication Side Effects: none  Family Medical/ Social History: Changes? No  MENTAL HEALTH EXAM:  There were no vitals taken for this visit.There is no height or weight on file to calculate BMI.  General Appearance: Casual and Well Groomed  Eye Contact:  Good  Speech:  Clear and Coherent and Normal Rate  Volume:  Normal  Mood:  Euthymic  Affect:  Congruent  Thought Process:  Goal Directed and Descriptions of Associations: Circumstantial  Orientation:  Full (Time, Place, and Person)  Thought Content: Logical   Suicidal Thoughts:  No  Homicidal Thoughts:  No  Memory:  WNL  Judgement:  Good  Insight:  Good  Psychomotor Activity:  Normal  Concentration:  Concentration: Good and Attention Span: Good  Recall:  Good  Fund of Knowledge: Good  Language: Good  Assets:  Communication Skills Desire for Improvement Financial Resources/Insurance Housing Transportation Vocational/Educational  ADL's:  Intact  Cognition: WNL  Prognosis:  Good   DIAGNOSES:    ICD-10-CM   1. Attention deficit hyperactivity disorder (ADHD), combined type  F90.2     2. Recurrent major depression in partial remission (HCC)  F33.41       Receiving Psychotherapy: Yes  with [name]  RECOMMENDATIONS:  PDMP reviewed.  Vyvanse filled 08/19/2023. I provided 20 minutes of face to face time during this encounter, including time spent before and after the visit in records review, medical decision making, counseling pertinent to today's  visit, and charting.   Is doing well so no changes need to be made.  Continue Vyvanse  30 mg, 1 q am.   Continue Zoloft 50 mg, 1 p.o. daily. Continue MVI daily. Continue therapy with [name]. Return in 6 months.  Melony Overly, PA-C

## 2023-11-17 ENCOUNTER — Telehealth: Payer: Self-pay | Admitting: Physician Assistant

## 2023-11-17 NOTE — Telephone Encounter (Signed)
Pt called and LM at 3:11pm. He said he had a specific question to ask T.Hurst.

## 2023-11-17 NOTE — Telephone Encounter (Signed)
Ali wanted to know if you'd consider seeing his 38 yo son; he  was diagnosed when he was 72, with adhd by a psychologist  He was on ritalin in the past (2019) , but he was having se of losing weight. He was wondering if since he was older now if that would be a good idea to put him back on there. Kaulder said that he is really struggling and wants him to succeed but doesn't think he's his best self right now.  Please advise.

## 2023-11-18 NOTE — Telephone Encounter (Signed)
Yes, I'll be happy to see him.  I think I'm booked out for 2-3 months though. Please let him know about Dr. Stevphen Rochester, I'm not sure if he can see him sooner or not.

## 2023-11-18 NOTE — Telephone Encounter (Signed)
Relayed information to [place]. He is going to talk it over with his wife and call us back.

## 2023-11-18 NOTE — Telephone Encounter (Signed)
Lvm to rc

## 2023-12-24 ENCOUNTER — Other Ambulatory Visit: Payer: Self-pay

## 2023-12-24 ENCOUNTER — Telehealth: Payer: Self-pay | Admitting: Physician Assistant

## 2023-12-24 MED ORDER — LISDEXAMFETAMINE DIMESYLATE 30 MG PO CAPS: 30.0000 mg | ORAL_CAPSULE | Freq: Every day | ORAL | 0 refills | Status: DC

## 2023-12-24 NOTE — Telephone Encounter (Signed)
 [facility]

## 2023-12-24 NOTE — Telephone Encounter (Signed)
 PT called asking for a refill on his vyvanse 30 mg. [facility] [place]. Next appt in june

## 2024-03-16 ENCOUNTER — Encounter: Payer: Self-pay | Admitting: Physician Assistant

## 2024-03-16 ENCOUNTER — Telehealth: Payer: BC Managed Care – PPO | Admitting: Physician Assistant

## 2024-03-16 DIAGNOSIS — F411 Generalized anxiety disorder: Secondary | ICD-10-CM | POA: Diagnosis not present

## 2024-03-16 DIAGNOSIS — F3341 Major depressive disorder, recurrent, in partial remission: Secondary | ICD-10-CM | POA: Diagnosis not present

## 2024-03-16 DIAGNOSIS — F902 Attention-deficit hyperactivity disorder, combined type: Secondary | ICD-10-CM | POA: Diagnosis not present

## 2024-03-16 MED ORDER — LISDEXAMFETAMINE DIMESYLATE 30 MG PO CAPS: 30.0000 mg | ORAL_CAPSULE | Freq: Every day | ORAL | 0 refills | Status: DC

## 2024-03-16 MED ORDER — SERTRALINE HCL 50 MG PO TABS: 50.0000 mg | ORAL_TABLET | Freq: Every day | ORAL | 1 refills | Status: DC

## 2024-03-16 NOTE — Progress Notes (Signed)
 [facility]  Patient ID: Walter Maldonado,  MRN: [id number]  PCP: Rodney Clamp, MD  Date of Evaluation: 03/16/2024 Time spent:20 minutes  Chief Complaint:  Chief Complaint   ADD; Anxiety; Depression    HISTORY/CURRENT STATUS: HPI for routine med check.  States he's doing really well. The Vyvanse  has been a Secretary/administrator. He's much more able to focus and concentrate to get things done.  He does notice times during the day where he feels he is not quite as able to stay on task.  He has wondered if increasing the dose would be helpful but then again he does not want to do anything that may negatively affect him.  Patient is able to enjoy things.  Energy and motivation are good.  Work is going well.   No extreme sadness, tearfulness, or feelings of hopelessness.  Sleeps well most of the time. ADLs and personal hygiene are normal.   Appetite has not changed.  Weight is stable.  No complaints of anxiety.  Denies suicidal or homicidal thoughts.  Patient denies increased energy with decreased need for sleep, increased talkativeness, racing thoughts, impulsivity or risky behaviors, increased spending, increased libido, grandiosity, increased irritability or anger, paranoia, or hallucinations.  Denies dizziness, syncope, seizures, numbness, tingling, tremor, tics, unsteady gait, slurred speech, confusion. Denies muscle or joint pain, stiffness, or dystonia.  Individual Medical History/ Review of Systems: Changes? :No   Past Psychiatric History:    Past medications for mental health diagnoses include: Zoloft  started 2012   No psych hospitalizations, no suicide attempts   Allergies: Penicillins  Current Medications:  Current Outpatient Medications:    [START ON 03/26/2024] lisdexamfetamine (VYVANSE ) 30 MG capsule, Take 1 capsule (30 mg total) by mouth daily., Disp: 30 capsule, Rfl: 0   [START ON 04/24/2024] lisdexamfetamine (VYVANSE ) 30 MG capsule, Take 1 capsule (30 mg total) by  mouth daily., Disp: 30 capsule, Rfl: 0   [START ON 05/24/2024] lisdexamfetamine (VYVANSE ) 30 MG capsule, Take 1 capsule (30 mg total) by mouth daily., Disp: 30 capsule, Rfl: 0   Multiple Vitamin (MULTIVITAMIN) capsule, Take 1 capsule by mouth daily. (Patient not taking: Reported on 03/16/2024), Disp: , Rfl:    sertraline  (ZOLOFT ) 50 MG tablet, Take 1 tablet (50 mg total) by mouth daily., Disp: 90 tablet, Rfl: 1 Medication Side Effects: none  Family Medical/ Social History: Changes? No  MENTAL HEALTH EXAM:  There were no vitals taken for this visit.There is no height or weight on file to calculate BMI.  General Appearance: Casual and Well Groomed  Eye Contact:  Good  Speech:  Clear and Coherent and Normal Rate  Volume:  Normal  Mood:  Euthymic  Affect:  Congruent  Thought Process:  Goal Directed and Descriptions of Associations: Circumstantial  Orientation:  Full (Time, Place, and Person)  Thought Content: Logical   Suicidal Thoughts:  No  Homicidal Thoughts:  No  Memory:  WNL  Judgement:  Good  Insight:  Good  Psychomotor Activity:  Normal  Concentration:  Concentration: Good and Attention Span: Good  Recall:  Good  Fund of Knowledge: Good  Language: Good  Assets:  Communication Skills Desire for Improvement Financial Resources/Insurance Housing Resilience Transportation Vocational/Educational  ADL's:  Intact  Cognition: WNL  Prognosis:  Good   DIAGNOSES:    ICD-10-CM   1. Attention deficit hyperactivity disorder (ADHD), combined type  F90.2     2. Recurrent major depression in partial remission (HCC)  F33.41     3. Generalized anxiety disorder  F41.1       Receiving Psychotherapy: Yes  with Mansfield Seip Deussing  RECOMMENDATIONS:  PDMP reviewed.  Vyvanse  filled 02/25/2024. I provided 20 minutes of face to face time during this encounter, including time spent before and after the visit in records review, medical decision making, counseling pertinent to today's visit, and  charting.   We discussed to the Vyvanse  dose.  It is appropriate to increase the dose if he would like to try that for 1 month.  He wants to discuss it with his wife first, get her perspective on how effective the drug is.  I will go ahead and send in 3 prescriptions for 30 mg as usual.  However if after discussing with his wife he wants to try the 40 mg for 1 month, he can call.  He knows that the other prescriptions on file would be discontinued.  He would then have to call and let me know the effects of the 40 mg and I would send in prescriptions from there.  Continue Vyvanse   30 mg, 1 q am.   Continue Zoloft  50 mg, 1 p.o. daily. Continue MVI daily. Continue therapy with Jessie Deussing. Return in 6 months.  Marvia Slocumb, PA-C

## 2024-05-02 ENCOUNTER — Other Ambulatory Visit: Payer: Self-pay

## 2024-05-02 ENCOUNTER — Telehealth: Payer: Self-pay | Admitting: Physician Assistant

## 2024-05-02 MED ORDER — SERTRALINE HCL 50 MG PO TABS: 50.0000 mg | ORAL_TABLET | Freq: Every day | ORAL | 0 refills | Status: DC

## 2024-05-02 NOTE — Telephone Encounter (Signed)
 Sertraline  sent, pended Vyvanse 

## 2024-05-02 NOTE — Telephone Encounter (Signed)
 Pt on vacation in [place]  and left his meds at home.Will be back 8/3. Requesting Rx for Sertraline  and Vyvanse  to [facility] [address] [place]  [phone]. Pt contact # [phone]

## 2024-05-03 MED ORDER — LISDEXAMFETAMINE DIMESYLATE 30 MG PO CAPS: 30.0000 mg | ORAL_CAPSULE | Freq: Every day | ORAL | 0 refills | Status: DC

## 2024-06-27 ENCOUNTER — Other Ambulatory Visit: Payer: Self-pay

## 2024-06-27 ENCOUNTER — Telehealth: Payer: Self-pay | Admitting: Physician Assistant

## 2024-06-27 DIAGNOSIS — F902 Attention-deficit hyperactivity disorder, combined type: Secondary | ICD-10-CM

## 2024-06-27 MED ORDER — LISDEXAMFETAMINE DIMESYLATE 30 MG PO CAPS: 30.0000 mg | ORAL_CAPSULE | Freq: Every day | ORAL | 0 refills | Status: AC

## 2024-06-27 MED ORDER — LISDEXAMFETAMINE DIMESYLATE 30 MG PO CAPS: 30.0000 mg | ORAL_CAPSULE | Freq: Every day | ORAL | 0 refills | Status: DC

## 2024-06-27 NOTE — Telephone Encounter (Signed)
 Pt LVM on 9/21 @ 2:27p requesting refill of Vyvanse  to   [id number] [place] [place] - [address] [place] [address] [facility] [address] [place] [address], [place] [address] Phone: [phone]  Fax: [phone]   He said he has 1 pill for today left. Next appt 12/11

## 2024-06-27 NOTE — Telephone Encounter (Signed)
 Pended

## 2024-07-25 DIAGNOSIS — Z0000 Encounter for general adult medical examination without abnormal findings: Secondary | ICD-10-CM | POA: Diagnosis not present

## 2024-07-27 ENCOUNTER — Telehealth: Payer: Self-pay | Admitting: Physician Assistant

## 2024-07-27 NOTE — Telephone Encounter (Signed)
 Did patient pick up the #6 Rx. LF per PMPD 9/22. Evidently the #6 was an error.

## 2024-07-27 NOTE — Telephone Encounter (Signed)
 Walter Maldonado lvm stating the pharmacy only gave him 6 pills of his vyvanse  30 mg. It looks like teresa only wrote for 6 pills and the next script is not until 08/23/24. So he needs to know why 6 were only sent in. Please send in a corrected script

## 2024-07-28 NOTE — Telephone Encounter (Signed)
 Picked up 10/22. Will send new Rx 10/27.

## 2024-07-28 NOTE — Telephone Encounter (Signed)
 He did pick up the six

## 2024-08-01 ENCOUNTER — Other Ambulatory Visit: Payer: Self-pay

## 2024-08-01 DIAGNOSIS — F902 Attention-deficit hyperactivity disorder, combined type: Secondary | ICD-10-CM

## 2024-08-01 MED ORDER — LISDEXAMFETAMINE DIMESYLATE 30 MG PO CAPS: 30.0000 mg | ORAL_CAPSULE | Freq: Every day | ORAL | 0 refills | Status: AC

## 2024-08-01 NOTE — Telephone Encounter (Signed)
 Pended

## 2024-09-15 ENCOUNTER — Ambulatory Visit: Admitting: Physician Assistant

## 2024-09-21 ENCOUNTER — Other Ambulatory Visit: Payer: Self-pay | Admitting: Physician Assistant

## 2024-09-23 ENCOUNTER — Telehealth: Admitting: Physician Assistant

## 2024-09-23 ENCOUNTER — Encounter: Payer: Self-pay | Admitting: Physician Assistant

## 2024-09-23 DIAGNOSIS — F3341 Major depressive disorder, recurrent, in partial remission: Secondary | ICD-10-CM

## 2024-09-23 DIAGNOSIS — F902 Attention-deficit hyperactivity disorder, combined type: Secondary | ICD-10-CM | POA: Diagnosis not present

## 2024-09-23 DIAGNOSIS — F411 Generalized anxiety disorder: Secondary | ICD-10-CM | POA: Diagnosis not present

## 2024-09-23 MED ORDER — LISDEXAMFETAMINE DIMESYLATE 30 MG PO CAPS: 30.0000 mg | ORAL_CAPSULE | Freq: Every day | ORAL | 0 refills | Status: AC

## 2024-09-23 MED ORDER — SERTRALINE HCL 50 MG PO TABS: 50.0000 mg | ORAL_TABLET | Freq: Every day | ORAL | Status: DC

## 2024-09-23 NOTE — Telephone Encounter (Signed)
 Pt has appt today

## 2024-09-23 NOTE — Progress Notes (Signed)
 "     [facility]  Patient ID: Walter Maldonado,  MRN: [id number]  PCP: Kennyth Worth HERO, MD  Date of Evaluation: 09/23/2024 Time spent:20 minutes  Chief Complaint:  Chief Complaint   ADHD    Virtual Visit via Telehealth  I connected with patient by telephone, with their informed consent, and verified patient privacy and that I am speaking with the correct person using two identifiers.  I am private, in my office and the patient is at work.  I discussed the limitations, risks, security and privacy concerns of performing an evaluation and management service by telephone and the availability of in person appointments. I also discussed with the patient that there may be a patient responsible charge related to this service. The patient expressed understanding and agreed to proceed.   I discussed the assessment and treatment plan with the patient. The patient was provided an opportunity to ask questions and all were answered. The patient agreed with the plan and demonstrated an understanding of the instructions.   The patient was advised to call back or seek an in-person evaluation if the symptoms worsen or if the condition fails to improve as anticipated.  I provided approximately 20  minutes of non-face-to-face time during this encounter.  HISTORY/CURRENT STATUS: HPI for routine med check.  Doing really well. Feels like his meds are working as intended. States that attention is good without easy distractibility.  Able to focus on things and finish tasks to completion. No c/o depression reported.  No anhedonia.  Work is going ok.  No feelings of hopelessness.  Sleeps ok.  ADLs and personal hygiene are normal.   No change in memory.  Appetite has not changed.  No mania, delirium, AH/VH.  No SI/HI.  Individual Medical History/ Review of Systems: Changes? :No   Past Psychiatric History:    Past medications for mental health diagnoses include: Zoloft  started 2012   No psych  hospitalizations, no suicide attempts   Allergies: Penicillins  Current Medications:  Current Outpatient Medications:    lisdexamfetamine  (VYVANSE ) 30 MG capsule, Take 1 capsule (30 mg total) by mouth daily., Disp: 30 capsule, Rfl: 0   lisdexamfetamine  (VYVANSE ) 30 MG capsule, Take 1 capsule (30 mg total) by mouth daily., Disp: 30 capsule, Rfl: 0   lisdexamfetamine  (VYVANSE ) 30 MG capsule, Take 1 capsule (30 mg total) by mouth daily., Disp: 30 capsule, Rfl: 0   Multiple Vitamin (MULTIVITAMIN) capsule, Take 1 capsule by mouth daily. (Patient not taking: Reported on 03/16/2024), Disp: , Rfl:    sertraline  (ZOLOFT ) 50 MG tablet, Take 1 tablet (50 mg total) by mouth daily., Disp: 7 tablet, Rfl: 0 Medication Side Effects: none  Family Medical/ Social History: Changes? No  MENTAL HEALTH EXAM:  There were no vitals taken for this visit.There is no height or weight on file to calculate BMI.  General Appearance: Casual and Well Groomed  Eye Contact:  Good  Speech:  Clear and Coherent and Normal Rate  Volume:  Normal  Mood:  Euthymic  Affect:  Congruent  Thought Process:  Goal Directed and Descriptions of Associations: Circumstantial  Orientation:  Full (Time, Place, and Person)  Thought Content: Logical   Suicidal Thoughts:  No  Homicidal Thoughts:  No  Memory:  WNL  Judgement:  Good  Insight:  Good  Psychomotor Activity:  Normal  Concentration:  Concentration: Good and Attention Span: Good  Recall:  Good  Fund of Knowledge: Good  Language: Good  Assets:  Communication Skills Desire  for Improvement Financial Resources/Insurance Housing Resilience Social Support Transportation Vocational/Educational  ADL's:  Intact  Cognition: WNL  Prognosis:  Good   DIAGNOSES:    ICD-10-CM   1. Attention deficit hyperactivity disorder (ADHD), combined type  F90.2     2. Recurrent major depression in partial remission  F33.41     3. Generalized anxiety disorder  F41.1      Receiving  Psychotherapy: Yes  with Albino Deussing  RECOMMENDATIONS:  PDMP reviewed.  Vyvanse  filled 08/30/2024 I provided approximately 20 minutes of non-face to face time during this encounter, including time spent before and after the visit in records review, medical decision making, counseling pertinent to today's visit, and charting.   He is doing well on the current treatment so no changes need to be made.  Continue Vyvanse   30 mg, 1 q am.   Continue Zoloft  50 mg, 1 p.o. daily. Continue therapy with Jessie Deussing. Return in 6 months.  Verneita Cooks, PA-C  "

## 2024-10-27 ENCOUNTER — Other Ambulatory Visit: Payer: Self-pay | Admitting: Physician Assistant
